# Patient Record
Sex: Female | Born: 1955 | Race: White | Hispanic: No | Marital: Married | Smoking: Never smoker
Health system: Southern US, Community
[De-identification: ages and names within clinical notes are randomized; demographics above are authoritative.]

## PROBLEM LIST (undated history)

## (undated) DIAGNOSIS — F419 Anxiety disorder, unspecified: Secondary | ICD-10-CM

## (undated) DIAGNOSIS — I10 Essential (primary) hypertension: Secondary | ICD-10-CM

## (undated) DIAGNOSIS — E785 Hyperlipidemia, unspecified: Secondary | ICD-10-CM

## (undated) DIAGNOSIS — H04123 Dry eye syndrome of bilateral lacrimal glands: Secondary | ICD-10-CM

## (undated) DIAGNOSIS — M1990 Unspecified osteoarthritis, unspecified site: Secondary | ICD-10-CM

## (undated) DIAGNOSIS — T7840XA Allergy, unspecified, initial encounter: Secondary | ICD-10-CM

## (undated) HISTORY — PX: TUBAL LIGATION: SHX77

## (undated) HISTORY — PX: OTHER SURGICAL HISTORY: SHX169

## (undated) HISTORY — DX: Hyperlipidemia, unspecified: E78.5

## (undated) HISTORY — PX: KNEE ARTHROSCOPY: SUR90

## (undated) HISTORY — DX: Essential (primary) hypertension: I10

## (undated) HISTORY — PX: CHOLECYSTECTOMY: SHX55

## (undated) HISTORY — DX: Anxiety disorder, unspecified: F41.9

## (undated) HISTORY — PX: URETHRAL SLING: SHX2621

## (undated) HISTORY — DX: Allergy, unspecified, initial encounter: T78.40XA

## (undated) HISTORY — DX: Unspecified osteoarthritis, unspecified site: M19.90

---

## 1999-08-16 ENCOUNTER — Encounter: Payer: Self-pay | Admitting: *Deleted

## 1999-08-16 ENCOUNTER — Encounter: Admission: RE | Admit: 1999-08-16 | Discharge: 1999-08-16 | Payer: Self-pay | Admitting: *Deleted

## 2000-02-22 ENCOUNTER — Ambulatory Visit (HOSPITAL_COMMUNITY): Admission: RE | Admit: 2000-02-22 | Discharge: 2000-02-22 | Payer: Self-pay | Admitting: Orthopedic Surgery

## 2000-02-22 ENCOUNTER — Encounter: Payer: Self-pay | Admitting: Orthopedic Surgery

## 2000-09-29 ENCOUNTER — Other Ambulatory Visit: Admission: RE | Admit: 2000-09-29 | Discharge: 2000-09-29 | Payer: Self-pay | Admitting: Emergency Medicine

## 2001-03-13 ENCOUNTER — Encounter: Admission: RE | Admit: 2001-03-13 | Discharge: 2001-03-13 | Payer: Self-pay | Admitting: Emergency Medicine

## 2001-03-13 ENCOUNTER — Encounter: Payer: Self-pay | Admitting: Emergency Medicine

## 2001-07-06 ENCOUNTER — Encounter: Admission: RE | Admit: 2001-07-06 | Discharge: 2001-07-06 | Payer: Self-pay | Admitting: Emergency Medicine

## 2001-07-06 ENCOUNTER — Encounter: Payer: Self-pay | Admitting: Emergency Medicine

## 2002-08-18 ENCOUNTER — Other Ambulatory Visit: Admission: RE | Admit: 2002-08-18 | Discharge: 2002-08-18 | Payer: Self-pay | Admitting: *Deleted

## 2002-09-24 ENCOUNTER — Encounter: Payer: Self-pay | Admitting: Emergency Medicine

## 2002-09-24 ENCOUNTER — Encounter: Admission: RE | Admit: 2002-09-24 | Discharge: 2002-09-24 | Payer: Self-pay | Admitting: Emergency Medicine

## 2002-10-08 ENCOUNTER — Ambulatory Visit (HOSPITAL_COMMUNITY): Admission: RE | Admit: 2002-10-08 | Discharge: 2002-10-08 | Payer: Self-pay | Admitting: *Deleted

## 2002-12-02 ENCOUNTER — Observation Stay (HOSPITAL_COMMUNITY): Admission: RE | Admit: 2002-12-02 | Discharge: 2002-12-03 | Payer: Self-pay | Admitting: General Surgery

## 2004-10-01 ENCOUNTER — Ambulatory Visit: Payer: Self-pay | Admitting: Gastroenterology

## 2004-10-12 ENCOUNTER — Ambulatory Visit: Payer: Self-pay | Admitting: Gastroenterology

## 2004-10-12 ENCOUNTER — Encounter (INDEPENDENT_AMBULATORY_CARE_PROVIDER_SITE_OTHER): Payer: Self-pay | Admitting: Specialist

## 2007-06-30 ENCOUNTER — Encounter: Admission: RE | Admit: 2007-06-30 | Discharge: 2007-06-30 | Payer: Self-pay | Admitting: Emergency Medicine

## 2009-09-20 ENCOUNTER — Encounter (INDEPENDENT_AMBULATORY_CARE_PROVIDER_SITE_OTHER): Payer: Self-pay | Admitting: *Deleted

## 2010-01-31 IMAGING — CT CT ABDOMEN W/ CM
2 of 5 series · 17 of 46 positions shown, 19 images · IV contrast (READICAT/WATER & [ID] OMNI 300)
Comparison: 09/24/2002

CT ABDOMEN

CLINICAL DATA: 51-year-old female intermittent right upper
quadrant pain.  Prior to cholecystectomy.

CT ABDOMEN AND PELVIS WITH CONTRAST
TECHNIQUE: Multidetector CT imaging of the abdomen and pelvis was
performed using the standard protocol following bolus
administration of intravenous contrast.
Contrast: 125 ml Lmnipaque-HNN

[Series 2: abdomen w/ · axial · 0.82mm/px · z∈[-448,-12]mm · 14 of 99 slices shown, 16 images]
[im 6/99  soft-tissue]
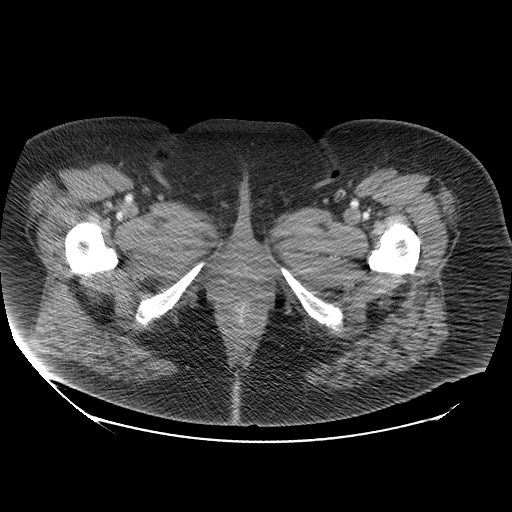
[im 6/99  bone]
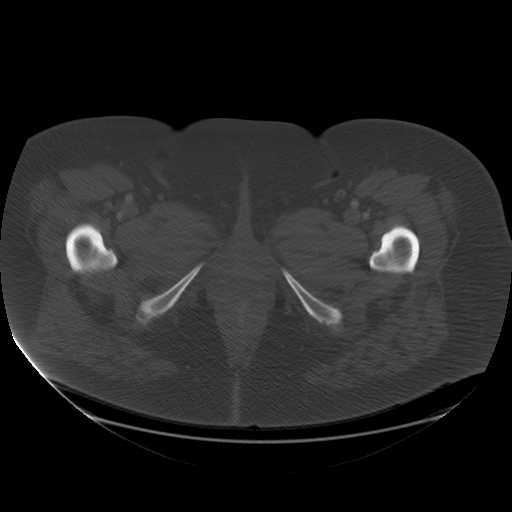
[im 11/99  soft-tissue]
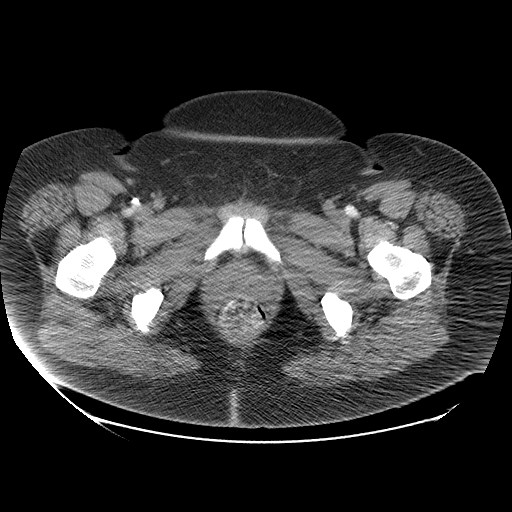
[im 21/99  soft-tissue]
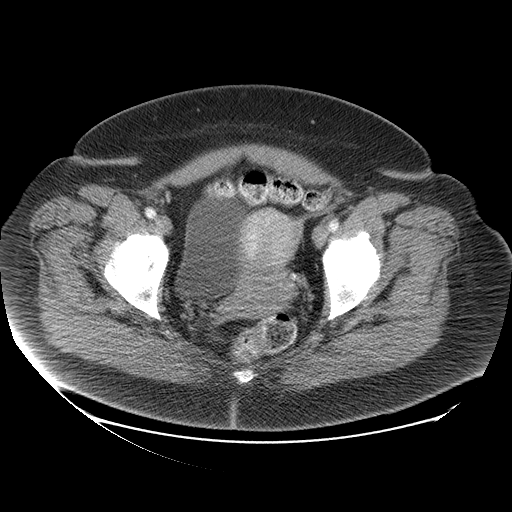
[im 26/99  soft-tissue]
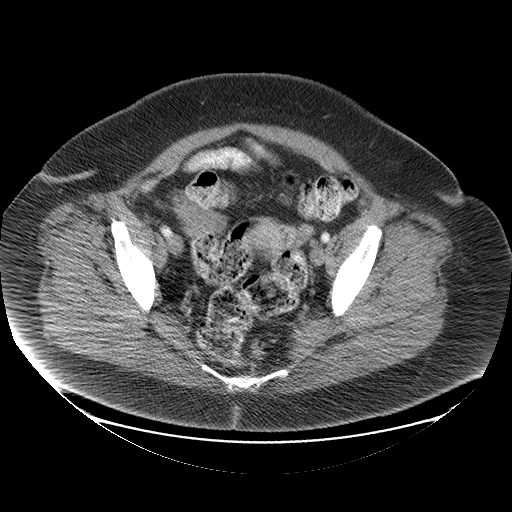
[im 31/99  soft-tissue]
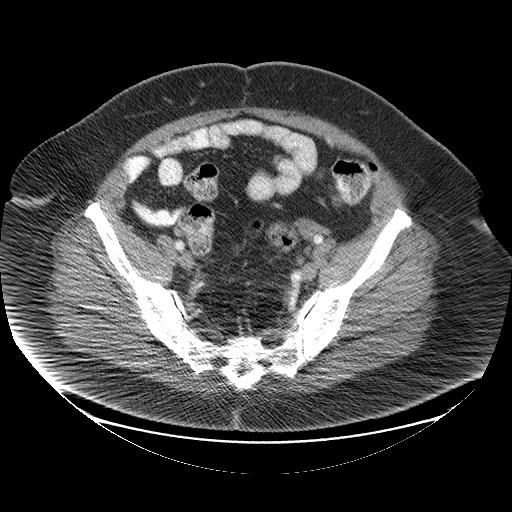
[im 42/99  soft-tissue]
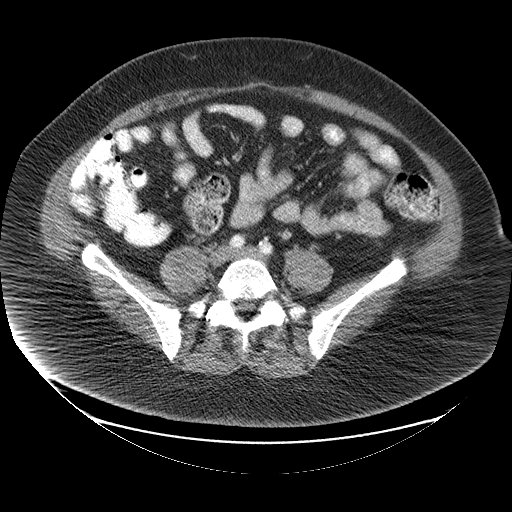
[im 47/99  soft-tissue]
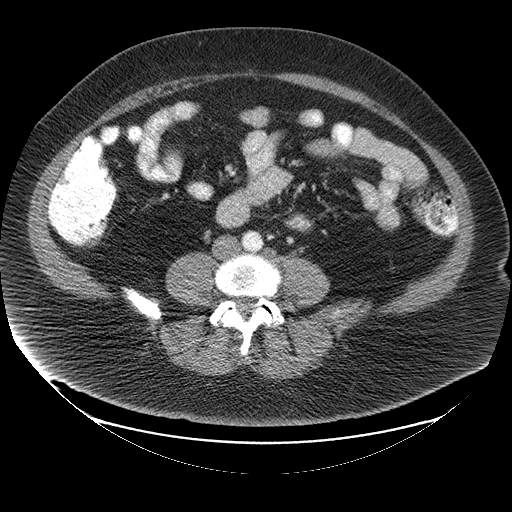
[im 52/99  soft-tissue]
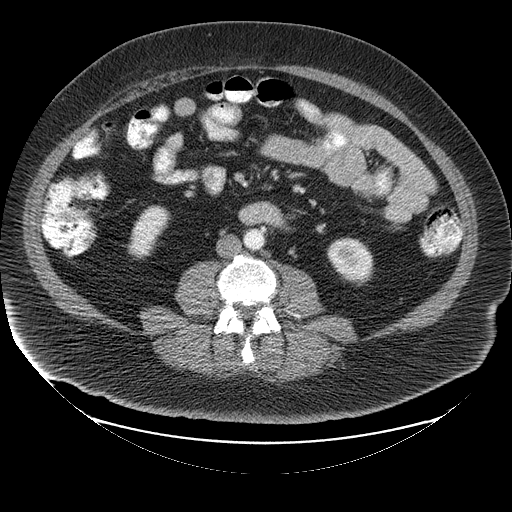
[im 57/99  soft-tissue]
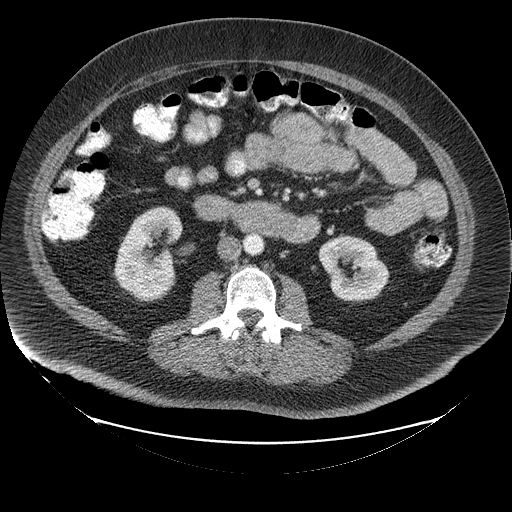
[im 57/99  bone]
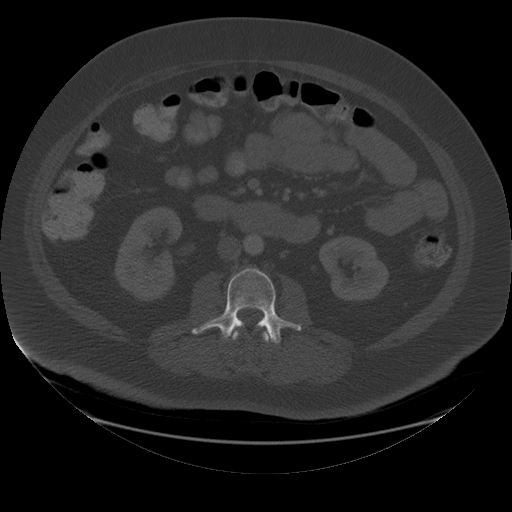
[im 68/99  soft-tissue]
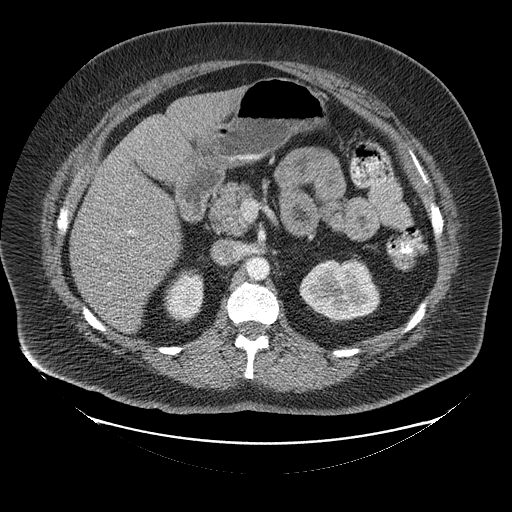
[im 73/99  soft-tissue]
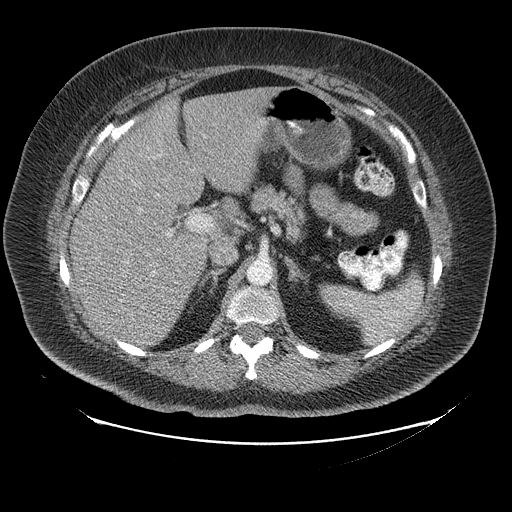
[im 78/99  soft-tissue]
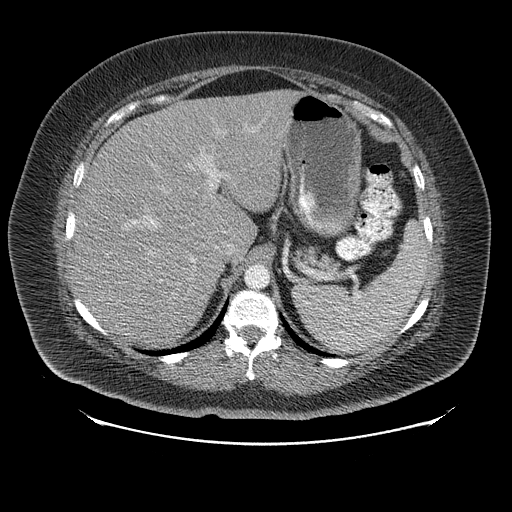
[im 88/99  soft-tissue]
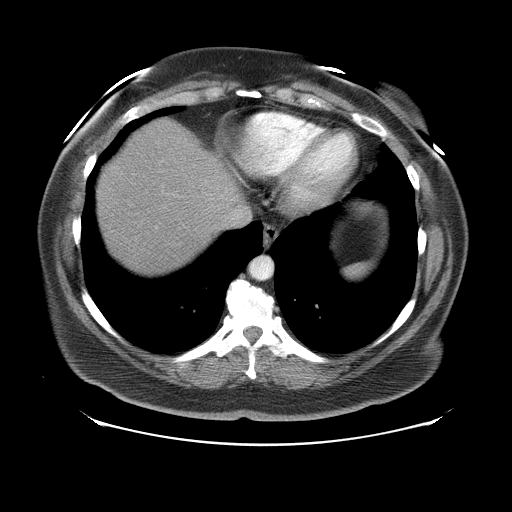
[im 93/99  soft-tissue]
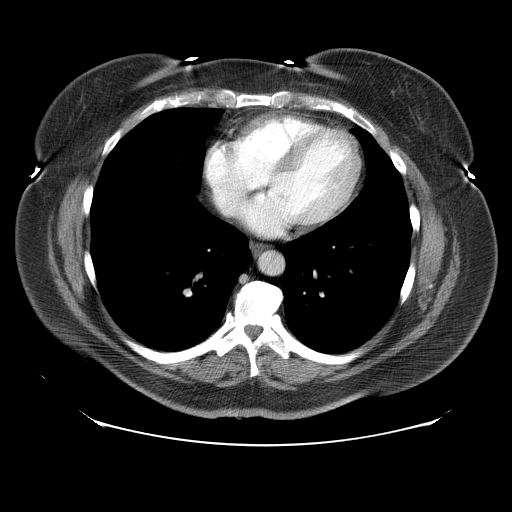

[Series 400: cor · coronal · 0.98mm/px · 3 of 137 slices shown]
[im 46/137  soft-tissue]
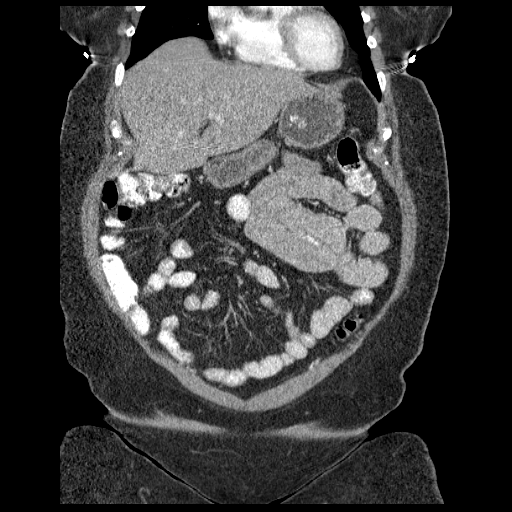
[im 61/137  soft-tissue]
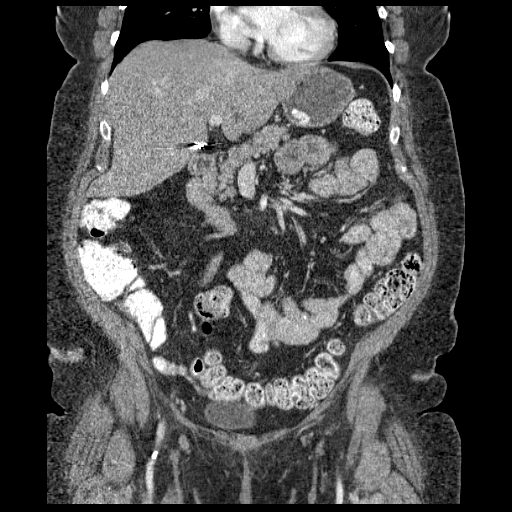
[im 76/137  soft-tissue]
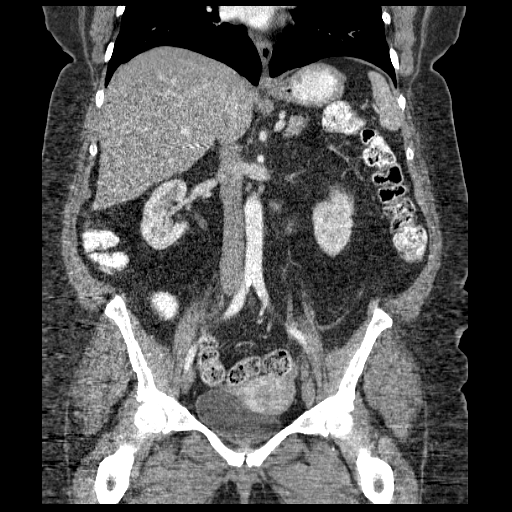

[17 of 46 positions shown; findings below may reference images not displayed]

FINDINGS: Lung bases are clear.  No pericardial or pleural
effusion.  Normal heart size.  No hiatal hernia.

In the abdomen, the patient is status post cholecystectomy.
Diffuse fatty infiltration is noted of the liver.  Hepatic and
portal veins are patent.  The right kidney, adrenal glands,
pancreas, spleen, and accessory splenules are within normal limits.
Left kidney demonstrates a stable 10 mm mid pole hypodense cortical
lesion compared to a CT exam from 09/24/2002 system with a small
renal cyst.  No renal obstruction or hydronephrosis.  No bowel
obstruction, dilatation, ileus pattern, or free air.  No free
fluid, abdominal acute inflammation, hemorrhage.  Mild degenerative
changes are noted of the lower thoracic and lumbar spine.
IMPRESSION: Mild fatty retraction of the liver suspected.

Status post cholecystectomy

Stable small left renal cyst.

No acute intra-abdominal finding.

CT PELVIS
FINDINGS: No pelvic free fluid, fluid collection, hemorrhage, or
abscess.  Urinary bladder, uterus and ovaries are within normal
limits.  Retained stool is noted in the distal colon.  No hernia.
No adenopathy.
IMPRESSION: No acute intrapelvic finding.

## 2010-02-22 NOTE — Letter (Signed)
Summary: Colonoscopy Letter  [facility]  [address] [place], [place] [address]   Phone: [phone]  Fax: [phone]      September 20, 2009 MRN: [id number]   LAKEYTA VANDENHEUVEL [address] [place], [place]  [address]   Dear Ms. Rizzi,   According to your medical record, it is time for you to schedule a Colonoscopy. The American Cancer Society recommends this procedure as a method to detect early colon cancer. Patients with a family history of colon cancer, or a personal history of colon polyps or inflammatory bowel disease are at increased risk.  This letter has beeen generated based on the recommendations made at the time of your procedure. If you feel that in your particular situation this may no longer apply, please contact our office.  Please call our office at [phone] to schedule this appointment or to update your records at your earliest convenience.  Thank you for cooperating with [place] to provide you with the very best care possible.   Sincerely,  Judie Petit T. Russella Dar, M.D.  [facility] [phone]

## 2010-06-08 NOTE — Op Note (Signed)
NAME:  April Bray, April Bray                          ACCOUNT NO.:  [id number]   MEDICAL RECORD NO.:  [id number]                   PATIENT TYPE:  AMB   LOCATION:  DFT[facility]                                 FACILITY:  [facility]   PHYSICIAN:  Timothy E. Earlene Plater, M.D.              DATE OF BIRTH:  11-16-1955   DATE OF PROCEDURE:  DATE OF DISCHARGE:  12/02/2002                                 OPERATIVE REPORT   PREOPERATIVE DIAGNOSIS:  Ventral incisional hernia.   POSTOPERATIVE DIAGNOSIS:  Adhesions.  No hernia found.   OPERATION/PROCEDURE:  1. Laparoscopy.  2. Lysis of adhesions.   SURGEON:  Timothy E. Earlene Plater, M.D.   ASSISTANT:  Angelia Mould. Derrell Lolling, M.D.   ANESTHESIA:  General.   INDICATIONS:  Please see the enclosed note.  Mrs. Crafts is 47.  She  underwent an open cholecystectomy in 1989.  Subsequently, she has had four  laparoscopic procedures via ___________ for gynecological reasons.  She is  obese, diabetic and hypertensive.  She was referred by Dr. Lorenz Coaster for  ventral incisional hernia.  She was examined in the office and it was  thought that she had one because of the bulge in the area.  Likewise, a CT  scan did show the rectus muscle bulging but no hernia.  After careful  explanation, the patient wished to proceed with laparoscopic repair of this  hernia.  This was carefully explained including the expectations of eventual  outcomes, long term, short term, and complications.  She was seen and  evaluated this morning.  Her medications are noted which are multiple.  Her  blood sugar is monitored.   DESCRIPTION OF PROCEDURE:  She was taken to the operating room and placed  supine.  General endotracheal anesthesia is administered.  A Foley catheter  was placed, PAS hose, orogastric tube.  The abdomen was prepped and draped  in the usual fashion.   We elected, Dr. Derrell Lolling and I, to use the Optiview to enter the abdomen and  the left upper quadrant.  This was accomplished by anesthetizing the  skin,  an incision made and the 11 mm port with mounted camera was gently advanced  through the abdominal wall.  There was a little bit of bleeding in the  muscular layer and vision was not good so we stopped and left the trocar in  place.  We made an incision in the infraumbilical area, identified,  peritoneum entered without complications, the Hasson catheter placed, the  abdomen insufflated and the scope introduced.   To our surprise, the 11 trocar in the left upper quadrant had actually  speared the mesentery of the small bowel.  We placed two 5 mm trocars, one  in the right lower quadrant and one in the left lower quadrant, under direct  vision.  We removed the loop of bowel from over the 11 trocar in the left  upper quadrant and very carefully  examined the mesentery and the bowel wall  with a close view with the camera, looking directly at the serosa of the  small bowel and the mesentery.  There was no perforation, no leakage, and no  bleeding.  We placed that loop of bowel in the left upper quadrant.   We then proceeded to bluntly take down a number of adhesions in the right  upper quadrant.  Once this was accomplished, to our surprise there was not a  fascial defect in the abdominal wall.  There were some scarring changes  where the adhesions had been but there was not a distinct hernia.  There  were no hernia edges or margins.  After careful consideration, we both  agreed and elected not to place mesh where was not a hernia.  The area was  checked.  The omentum exhibited no unusual bleeding.  We again inspected the  loop of small bowel in the left upper quadrant and there was no  complications.   With this, the procedure was complete.  The counts were correct.  Trocars  were removed under direct vision along with the CO2.  The infraumbilical  incision was tied with the previously placed #1 Vicryl.  I did place a #1  Vicryl in the fascia of the left upper quadrant 11 mm wound.   Skin incisions  were closed with 3-0 Vicryl.  Final counts were correct.  She tolerated it  well.  Dry sterile dressing were applied.  She was taken to the recovery  room in good condition.                                               Timothy E. Earlene Plater, M.D.    TED/MEDQ  D:  12/02/2002  T:  12/02/2002  Job:  [id number]   cc:   Reuben Likes, M.D.  [address]  [place]  [place] [address]  Fax: [phone]

## 2010-06-27 ENCOUNTER — Encounter: Payer: Self-pay | Admitting: Gastroenterology

## 2010-07-23 ENCOUNTER — Ambulatory Visit (AMBULATORY_SURGERY_CENTER): Payer: No Typology Code available for payment source | Admitting: *Deleted

## 2010-07-23 ENCOUNTER — Encounter: Payer: Self-pay | Admitting: Gastroenterology

## 2010-07-23 VITALS — Ht 65.0 in | Wt 256.6 lb

## 2010-07-23 DIAGNOSIS — Z800 Family history of malignant neoplasm of digestive organs: Secondary | ICD-10-CM

## 2010-07-23 DIAGNOSIS — Z8371 Family history of colonic polyps: Secondary | ICD-10-CM

## 2010-07-23 MED ORDER — PEG-KCL-NACL-NASULF-NA ASC-C 100 G PO SOLR: ORAL | Status: DC

## 2010-08-07 ENCOUNTER — Ambulatory Visit (AMBULATORY_SURGERY_CENTER): Payer: No Typology Code available for payment source | Admitting: Gastroenterology

## 2010-08-07 ENCOUNTER — Encounter: Payer: Self-pay | Admitting: Gastroenterology

## 2010-08-07 DIAGNOSIS — Z86010 Personal history of colonic polyps: Secondary | ICD-10-CM

## 2010-08-07 DIAGNOSIS — Z8371 Family history of colonic polyps: Secondary | ICD-10-CM

## 2010-08-07 DIAGNOSIS — D126 Benign neoplasm of colon, unspecified: Secondary | ICD-10-CM

## 2010-08-07 DIAGNOSIS — Z1211 Encounter for screening for malignant neoplasm of colon: Secondary | ICD-10-CM

## 2010-08-07 DIAGNOSIS — Z800 Family history of malignant neoplasm of digestive organs: Secondary | ICD-10-CM

## 2010-08-07 MED ORDER — SODIUM CHLORIDE 0.9 % IV SOLN: 500.0000 mL | INTRAVENOUS | Status: DC

## 2010-08-07 NOTE — Progress Notes (Signed)
No complaints on dicaharge. MAW

## 2010-08-08 ENCOUNTER — Telehealth: Payer: Self-pay

## 2010-08-08 NOTE — Telephone Encounter (Signed)

## 2010-08-13 ENCOUNTER — Encounter: Payer: Self-pay | Admitting: Gastroenterology

## 2012-07-14 ENCOUNTER — Encounter (HOSPITAL_COMMUNITY): Payer: Self-pay

## 2012-07-14 ENCOUNTER — Ambulatory Visit (HOSPITAL_COMMUNITY)
Admission: RE | Admit: 2012-07-14 | Discharge: 2012-07-14 | Disposition: A | Discharge status: Home / Self Care | Payer: No Typology Code available for payment source | Source: Ambulatory Visit | Attending: Orthopedic Surgery | Admitting: Orthopedic Surgery

## 2012-07-14 ENCOUNTER — Encounter (HOSPITAL_COMMUNITY): Payer: Self-pay | Admitting: Pharmacy Technician

## 2012-07-14 ENCOUNTER — Encounter (HOSPITAL_COMMUNITY)
Admission: RE | Admit: 2012-07-14 | Discharge: 2012-07-14 | Disposition: A | Discharge status: Home / Self Care | Payer: No Typology Code available for payment source | Source: Ambulatory Visit | Attending: Orthopedic Surgery | Admitting: Orthopedic Surgery

## 2012-07-14 DIAGNOSIS — M1710 Unilateral primary osteoarthritis, unspecified knee: Secondary | ICD-10-CM | POA: Insufficient documentation

## 2012-07-14 DIAGNOSIS — I10 Essential (primary) hypertension: Secondary | ICD-10-CM | POA: Insufficient documentation

## 2012-07-14 DIAGNOSIS — IMO0002 Reserved for concepts with insufficient information to code with codable children: Secondary | ICD-10-CM | POA: Insufficient documentation

## 2012-07-14 DIAGNOSIS — Z01818 Encounter for other preprocedural examination: Secondary | ICD-10-CM | POA: Insufficient documentation

## 2012-07-14 DIAGNOSIS — H04123 Dry eye syndrome of bilateral lacrimal glands: Secondary | ICD-10-CM

## 2012-07-14 HISTORY — DX: Dry eye syndrome of bilateral lacrimal glands: H04.123

## 2012-07-14 LAB — URINALYSIS, ROUTINE W REFLEX MICROSCOPIC
Bilirubin Urine: NEGATIVE
Glucose, UA: 250 mg/dL — AB
Hgb urine dipstick: NEGATIVE
Ketones, ur: NEGATIVE mg/dL
Nitrite: NEGATIVE
pH: 6.5 (ref 5.0–8.0)

## 2012-07-14 LAB — BASIC METABOLIC PANEL
BUN: 12 mg/dL (ref 6–23)
Calcium: 10.1 mg/dL (ref 8.4–10.5)
GFR calc Af Amer: >90 mL/min (ref >90)
GFR calc non Af Amer: >90 mL/min (ref >90)
Potassium: 4.7 mEq/L (ref 3.5–5.1)
Sodium: 139 mEq/L (ref 135–145)

## 2012-07-14 LAB — PROTIME-INR
INR: 0.9 (ref 0.00–1.49)
Prothrombin Time: 12.1 seconds (ref 11.6–15.2)

## 2012-07-14 LAB — URINE MICROSCOPIC-ADD ON

## 2012-07-14 LAB — CBC
Hemoglobin: 14.1 g/dL (ref 12.0–15.0)
MCHC: 33.3 g/dL (ref 30.0–36.0)
Platelets: 239 10*3/uL (ref 150–400)

## 2012-07-14 NOTE — Patient Instructions (Addendum)
20 BRITAINY KOZUB  07/14/2012   Your procedure is scheduled on: 6-30  -2014  Report to [facility] at   0700     AM   Call this number if you have problems the morning of surgery: [phone]  Or Presurgical Testing [phone](Kendrik Mcshan)      Do not eat food:After Midnight.    Take these medicines the morning of surgery with A SIP OF WATER: Zyrtec. Zoloft. No Diabetic meds or insulin AM of. Take 1/2 usual dose Lantus Pm before, no Pm dose of Byetta night before.   Do not wear jewelry, make-up or nail polish.  Do not wear lotions, powders, or perfumes. You may wear deodorant.  Do not shave 12 hours prior to first CHG shower(legs and under arms).(face and neck okay.)  Do not bring valuables to the hospital.  Contacts, dentures or bridgework,body piercing,  may not be worn into surgery.  Leave suitcase in the car. After surgery it may be brought to your room.  For patients admitted to the hospital, checkout time is 11:00 AM the day of discharge.   Patients discharged the day of surgery will not be allowed to drive home. Must have responsible person with you x 24 hours once discharged.  Name and phone number of your driver: Der Gagliano, spouse [phone] cell  Special Instructions: CHG(Chlorhedine 4%-"Hibiclens","Betasept","Aplicare") Shower Use Special Wash: see special instructions.(avoid face and genitals)   Please read over the following fact sheets that you were given: MRSA Information, Blood Transfusion fact sheet, Incentive Spirometry Instruction.    Failure to follow these instructions may result in Cancellation of your surgery.   Patient signature_______________________________________________________

## 2012-07-14 NOTE — Pre-Procedure Instructions (Addendum)
07-14-12 EKG 06-24-12-report with chart, CXR today. 07-14-12 1640 Labs faxed to Dr. Nilsa Nutting office, note urinalysis. 07-16-12- fax received-Cipro called in to pt's pharmacy per Dr. Nilsa Nutting office.

## 2012-07-14 NOTE — Progress Notes (Signed)
07-14-12 1635 Labs viewable in Epic, note Urinalysis and BMP(is a known diabetic).

## 2012-07-15 LAB — URINE CULTURE

## 2012-07-16 NOTE — H&P (Signed)
TOTAL KNEE ADMISSION H&P  Patient is being admitted for bilateral total knee arthroplasties.  Subjective:  Chief Complaint:  Bilaterally knee OA / pain.  HPI: April Bray, 57 y.o. female, has a history of pain and functional disability in the bilaterally knees due to arthritis and has failed non-surgical conservative treatments for greater than 12 weeks to includeNSAID's and/or analgesics and activity modification.  Onset of symptoms was gradual, starting 3 years ago with gradually worsening course since that time. The patient noted prior procedures on the knee to include  arthroscopy on the right knee(s).  Patient currently rates pain in the bilaterally knee(s) at 10 out of 10 with activity. Patient has night pain, worsening of pain with activity and weight bearing, pain that interferes with activities of daily living, pain with passive range of motion, crepitus and joint swelling.  Patient has evidence of periarticular osteophytes and joint space narrowing by imaging studies. There is no active signs of infection.  Risks, benefits and expectations were discussed with the patient. Patient understand the risks, benefits and expectations and wishes to proceed with surgery.   D/C Plans:   Home with HHPT  Post-op Meds:    Rx given for ASA, Zanaflex, Iron, Colace and MiraLax  Tranexamic Acid:   To be given  Decadron:    Not to be given - DM  FYI:    Xarelto for 2 weeks and then ASA for 4    Past Medical History  Diagnosis Date  . Allergy   . Anxiety   . Arthritis   . Diabetes mellitus   . Hypertension   . Hyperlipidemia   . Bilateral dry eyes 07-14-12    allery and dry eyes    Past Surgical History  Procedure Laterality Date  . Knee arthroscopy      right  . Tubal ligation    . Ovarion cyst      bilateral  . Cholecystectomy    . Urethral sling      No Known Allergies   History  Substance Use Topics  . Smoking status: Never Smoker   . Smokeless tobacco: Not on file  .  Alcohol Use: No    Family History  Problem Relation Age of Onset  . Colon polyps Father   . Colon cancer Maternal Grandmother   . Colon cancer Maternal Grandfather      Review of Systems  Constitutional: Negative.   HENT: Negative.   Eyes: Negative.   Respiratory: Negative.   Cardiovascular: Negative.   Gastrointestinal: Negative.   Genitourinary: Positive for urgency and frequency.  Musculoskeletal: Positive for joint pain.  Skin: Negative.   Neurological: Negative.   Endo/Heme/Allergies: Positive for environmental allergies.  Psychiatric/Behavioral: The patient is nervous/anxious.     Objective:  Physical Exam  Constitutional: She is oriented to person, place, and time. She appears well-developed and well-nourished.  HENT:  Head: Normocephalic and atraumatic.  Mouth/Throat: Oropharynx is clear and moist.  Eyes: Pupils are equal, round, and reactive to light.  Neck: Neck supple. No JVD present. No tracheal deviation present. No thyromegaly present.  Cardiovascular: Normal rate, regular rhythm, normal heart sounds and intact distal pulses.   Respiratory: Effort normal and breath sounds normal. No stridor. No respiratory distress. She has no wheezes.  GI: Soft. There is no tenderness. There is no guarding.  Musculoskeletal:       Right knee: She exhibits decreased range of motion, swelling and bony tenderness. She exhibits no effusion, no ecchymosis, no deformity, no  laceration and no erythema. Tenderness found.       Left knee: She exhibits decreased range of motion, swelling and bony tenderness. She exhibits no effusion, no ecchymosis, no deformity, no laceration and no erythema. Tenderness found.  Lymphadenopathy:    She has no cervical adenopathy.  Neurological: She is alert and oriented to person, place, and time.  Skin: Skin is warm and dry.  Psychiatric: She has a normal mood and affect.    Labs:  Estimated body mass index is 42.60 kg/(m^2) as calculated from the  following:   Height as of 08/07/10: 5\' 5"  (1.651 m).   Weight as of 08/07/10: 116.121 kg (256 lb).   Imaging Review Plain radiographs demonstrate severe degenerative joint disease of the bilaterally knee(s). The overall alignment is neutral. The bone quality appears to be good for age and reported activity level.  Assessment/Plan:  End stage arthritis, bilaterally knee   The patient history, physical examination, clinical judgment of the provider and imaging studies are consistent with end stage degenerative joint disease of the bilaterally knee(s) and total knee arthroplasty is deemed medically necessary. The treatment options including medical management, injection therapy arthroscopy and arthroplasty were discussed at length. The risks and benefits of total knee arthroplasty were presented and reviewed. The risks due to aseptic loosening, infection, stiffness, patella tracking problems, thromboembolic complications and other imponderables were discussed. The patient acknowledged the explanation, agreed to proceed with the plan and consent was signed. Patient is being admitted for inpatient treatment for surgery, pain control, PT, OT, prophylactic antibiotics, VTE prophylaxis, progressive ambulation and ADL's and discharge planning. The patient is planning to be discharged home with home health services.   Anastasio Auerbach Banessa Mao   PAC  07/16/2012, 10:38 AM

## 2012-07-20 ENCOUNTER — Encounter (HOSPITAL_COMMUNITY): Payer: Self-pay | Admitting: *Deleted

## 2012-07-20 ENCOUNTER — Encounter (HOSPITAL_COMMUNITY)
Admission: RE | Disposition: A | Discharge status: Skilled Nursing Facility | Payer: Self-pay | Source: Ambulatory Visit | Attending: Orthopedic Surgery

## 2012-07-20 ENCOUNTER — Ambulatory Visit (HOSPITAL_COMMUNITY): Payer: No Typology Code available for payment source | Admitting: Anesthesiology

## 2012-07-20 ENCOUNTER — Inpatient Hospital Stay (HOSPITAL_COMMUNITY)
Admission: RE | Admit: 2012-07-20 | Discharge: 2012-07-23 | DRG: 462 | Disposition: A | Discharge status: Skilled Nursing Facility | Payer: No Typology Code available for payment source | Source: Ambulatory Visit | Attending: Orthopedic Surgery | Admitting: Orthopedic Surgery

## 2012-07-20 ENCOUNTER — Encounter (HOSPITAL_COMMUNITY): Payer: Self-pay | Admitting: Anesthesiology

## 2012-07-20 DIAGNOSIS — M1710 Unilateral primary osteoarthritis, unspecified knee: Principal | ICD-10-CM | POA: Diagnosis present

## 2012-07-20 DIAGNOSIS — Z96653 Presence of artificial knee joint, bilateral: Secondary | ICD-10-CM

## 2012-07-20 DIAGNOSIS — E669 Obesity, unspecified: Secondary | ICD-10-CM | POA: Diagnosis present

## 2012-07-20 DIAGNOSIS — E119 Type 2 diabetes mellitus without complications: Secondary | ICD-10-CM | POA: Diagnosis present

## 2012-07-20 DIAGNOSIS — D62 Acute posthemorrhagic anemia: Secondary | ICD-10-CM | POA: Diagnosis not present

## 2012-07-20 DIAGNOSIS — Z6830 Body mass index (BMI) 30.0-30.9, adult: Secondary | ICD-10-CM

## 2012-07-20 DIAGNOSIS — D500 Iron deficiency anemia secondary to blood loss (chronic): Secondary | ICD-10-CM | POA: Diagnosis not present

## 2012-07-20 HISTORY — PX: TOTAL KNEE ARTHROPLASTY: SHX125

## 2012-07-20 LAB — GLUCOSE, CAPILLARY: Glucose-Capillary: 221 mg/dL — ABNORMAL HIGH (ref 70–99)

## 2012-07-20 LAB — TYPE AND SCREEN

## 2012-07-20 SURGERY — ARTHROPLASTY, KNEE, BILATERAL, TOTAL
Anesthesia: General | Site: Knee | Laterality: Bilateral | Wound class: Clean

## 2012-07-20 MED ORDER — INSULIN GLARGINE 100 UNIT/ML ~~LOC~~ SOLN
30.0000 [IU] | Freq: Every day | SUBCUTANEOUS | Status: DC
  Administered 2012-07-20 – 2012-07-22 (×3): 30 [IU] via SUBCUTANEOUS
  Filled 2012-07-20 (×4): qty 0.3

## 2012-07-20 MED ORDER — BUPIVACAINE-EPINEPHRINE PF 0.25-1:200000 % IJ SOLN
INTRAMUSCULAR | Status: AC
  Filled 2012-07-20: qty 30

## 2012-07-20 MED ORDER — EPHEDRINE SULFATE 50 MG/ML IJ SOLN
INTRAMUSCULAR | Status: DC | PRN
  Administered 2012-07-20: 5 mg via INTRAVENOUS

## 2012-07-20 MED ORDER — HYDROMORPHONE HCL PF 1 MG/ML IJ SOLN
INTRAMUSCULAR | Status: DC | PRN
  Administered 2012-07-20 (×2): 1 mg via INTRAVENOUS

## 2012-07-20 MED ORDER — POLYETHYLENE GLYCOL 3350 17 G PO PACK
17.0000 g | PACK | Freq: Two times a day (BID) | ORAL | Status: DC
  Administered 2012-07-20 – 2012-07-23 (×6): 17 g via ORAL

## 2012-07-20 MED ORDER — 0.9 % SODIUM CHLORIDE (POUR BTL) OPTIME
TOPICAL | Status: DC | PRN
  Administered 2012-07-20: 1000 mL

## 2012-07-20 MED ORDER — CEFAZOLIN SODIUM-DEXTROSE 2-3 GM-% IV SOLR
INTRAVENOUS | Status: AC
  Filled 2012-07-20: qty 50

## 2012-07-20 MED ORDER — METOCLOPRAMIDE HCL 10 MG PO TABS: 5.0000 mg | ORAL_TABLET | Freq: Three times a day (TID) | ORAL | Status: DC | PRN

## 2012-07-20 MED ORDER — DOCUSATE SODIUM 100 MG PO CAPS
100.0000 mg | ORAL_CAPSULE | Freq: Two times a day (BID) | ORAL | Status: DC
  Administered 2012-07-20 – 2012-07-23 (×6): 100 mg via ORAL

## 2012-07-20 MED ORDER — DIPHENHYDRAMINE HCL 25 MG PO CAPS: 25.0000 mg | ORAL_CAPSULE | Freq: Four times a day (QID) | ORAL | Status: DC | PRN

## 2012-07-20 MED ORDER — KETOROLAC TROMETHAMINE 30 MG/ML IJ SOLN
INTRAMUSCULAR | Status: DC | PRN
  Administered 2012-07-20: 30 mg

## 2012-07-20 MED ORDER — BUPIVACAINE HCL (PF) 0.25 % IJ SOLN
INTRAMUSCULAR | Status: DC | PRN
  Administered 2012-07-20: 7 mL
  Administered 2012-07-20: 8 mL

## 2012-07-20 MED ORDER — ROCURONIUM BROMIDE 100 MG/10ML IV SOLN
INTRAVENOUS | Status: DC | PRN
  Administered 2012-07-20: 50 mg via INTRAVENOUS

## 2012-07-20 MED ORDER — SERTRALINE HCL 25 MG PO TABS
25.0000 mg | ORAL_TABLET | Freq: Every morning | ORAL | Status: DC
  Administered 2012-07-21 – 2012-07-23 (×3): 25 mg via ORAL
  Filled 2012-07-20 (×3): qty 1

## 2012-07-20 MED ORDER — INSULIN ASPART 100 UNIT/ML ~~LOC~~ SOLN
0.0000 [IU] | SUBCUTANEOUS | Status: DC
Start: 2012-07-20 — End: 2012-07-20
  Administered 2012-07-20: 5 [IU] via SUBCUTANEOUS

## 2012-07-20 MED ORDER — INSULIN ASPART 100 UNIT/ML ~~LOC~~ SOLN
0.0000 [IU] | Freq: Three times a day (TID) | SUBCUTANEOUS | Status: DC
  Administered 2012-07-20: 5 [IU] via SUBCUTANEOUS
  Administered 2012-07-21: 3 [IU] via SUBCUTANEOUS
  Administered 2012-07-21: 8 [IU] via SUBCUTANEOUS
  Administered 2012-07-22 (×2): 5 [IU] via SUBCUTANEOUS
  Administered 2012-07-22: 3 [IU] via SUBCUTANEOUS
  Administered 2012-07-23: 5 [IU] via SUBCUTANEOUS
  Administered 2012-07-23: 08:00:00 via SUBCUTANEOUS

## 2012-07-20 MED ORDER — KETOROLAC TROMETHAMINE 30 MG/ML IJ SOLN
INTRAMUSCULAR | Status: AC
  Filled 2012-07-20: qty 2

## 2012-07-20 MED ORDER — HYDROMORPHONE HCL PF 1 MG/ML IJ SOLN
INTRAMUSCULAR | Status: AC
  Administered 2012-07-20: 1 mg via INTRAVENOUS
  Filled 2012-07-20: qty 1

## 2012-07-20 MED ORDER — METFORMIN HCL 500 MG PO TABS
1000.0000 mg | ORAL_TABLET | Freq: Two times a day (BID) | ORAL | Status: DC
  Administered 2012-07-20 – 2012-07-23 (×5): 1000 mg via ORAL
  Filled 2012-07-20 (×8): qty 2

## 2012-07-20 MED ORDER — PROPOFOL 10 MG/ML IV BOLUS
INTRAVENOUS | Status: DC | PRN
  Administered 2012-07-20: 200 mg via INTRAVENOUS

## 2012-07-20 MED ORDER — LACTATED RINGERS IV SOLN
INTRAVENOUS | Status: DC
  Administered 2012-07-20: 15:00:00 via INTRAVENOUS

## 2012-07-20 MED ORDER — SODIUM CHLORIDE 0.9 % IJ SOLN
INTRAMUSCULAR | Status: DC | PRN
  Administered 2012-07-20: 13:00:00

## 2012-07-20 MED ORDER — ZOLPIDEM TARTRATE 5 MG PO TABS: 5.0000 mg | ORAL_TABLET | Freq: Every evening | ORAL | Status: DC | PRN

## 2012-07-20 MED ORDER — SODIUM CHLORIDE 0.9 % IV SOLN
INTRAVENOUS | Status: DC
  Administered 2012-07-20 – 2012-07-21 (×2): via EPIDURAL
  Filled 2012-07-20 (×9): qty 20

## 2012-07-20 MED ORDER — LIDOCAINE HCL (CARDIAC) 20 MG/ML IV SOLN
INTRAVENOUS | Status: DC | PRN
  Administered 2012-07-20: 50 mg via INTRAVENOUS

## 2012-07-20 MED ORDER — FENTANYL CITRATE 0.05 MG/ML IJ SOLN
INTRAMUSCULAR | Status: DC | PRN
  Administered 2012-07-20: 50 ug via INTRAVENOUS
  Administered 2012-07-20 (×3): 100 ug via INTRAVENOUS

## 2012-07-20 MED ORDER — HYDROMORPHONE HCL PF 1 MG/ML IJ SOLN
0.5000 mg | INTRAMUSCULAR | Status: DC | PRN
  Administered 2012-07-21 – 2012-07-22 (×4): 1 mg via INTRAVENOUS
  Filled 2012-07-20 (×5): qty 1

## 2012-07-20 MED ORDER — HYDROCODONE-ACETAMINOPHEN 7.5-325 MG PO TABS
1.0000 | ORAL_TABLET | ORAL | Status: DC
  Administered 2012-07-20 – 2012-07-22 (×11): 2 via ORAL
  Administered 2012-07-22 (×2): 1 via ORAL
  Administered 2012-07-22 – 2012-07-23 (×4): 2 via ORAL
  Filled 2012-07-20 (×18): qty 2

## 2012-07-20 MED ORDER — CELECOXIB 200 MG PO CAPS
200.0000 mg | ORAL_CAPSULE | Freq: Two times a day (BID) | ORAL | Status: DC
  Administered 2012-07-20 – 2012-07-23 (×6): 200 mg via ORAL
  Filled 2012-07-20 (×8): qty 1

## 2012-07-20 MED ORDER — ATORVASTATIN CALCIUM 10 MG PO TABS
10.0000 mg | ORAL_TABLET | Freq: Every day | ORAL | Status: DC
  Administered 2012-07-20 – 2012-07-22 (×3): 10 mg via ORAL
  Filled 2012-07-20 (×4): qty 1

## 2012-07-20 MED ORDER — ONDANSETRON HCL 4 MG/2ML IJ SOLN
INTRAMUSCULAR | Status: DC | PRN
  Administered 2012-07-20: 4 mg via INTRAVENOUS

## 2012-07-20 MED ORDER — SODIUM CHLORIDE 0.9 % IR SOLN
Status: DC | PRN
  Administered 2012-07-20: 3000 mL

## 2012-07-20 MED ORDER — MENTHOL 3 MG MT LOZG: 1.0000 | LOZENGE | OROMUCOSAL | Status: DC | PRN

## 2012-07-20 MED ORDER — METHOCARBAMOL 500 MG PO TABS
500.0000 mg | ORAL_TABLET | Freq: Four times a day (QID) | ORAL | Status: DC | PRN
  Administered 2012-07-20 – 2012-07-23 (×10): 500 mg via ORAL
  Filled 2012-07-20 (×12): qty 1

## 2012-07-20 MED ORDER — BUPIVACAINE-EPINEPHRINE 0.25% -1:200000 IJ SOLN
INTRAMUSCULAR | Status: DC | PRN
  Administered 2012-07-20: 24 mL

## 2012-07-20 MED ORDER — SODIUM CHLORIDE 0.9 % IJ SOLN
INTRAMUSCULAR | Status: DC | PRN
  Administered 2012-07-20: 12:00:00

## 2012-07-20 MED ORDER — ONDANSETRON HCL 4 MG PO TABS: 4.0000 mg | ORAL_TABLET | Freq: Four times a day (QID) | ORAL | Status: DC | PRN

## 2012-07-20 MED ORDER — CHLORHEXIDINE GLUCONATE 4 % EX LIQD: 60.0000 mL | Freq: Once | CUTANEOUS | Status: DC

## 2012-07-20 MED ORDER — POTASSIUM CHLORIDE 2 MEQ/ML IV SOLN
INTRAVENOUS | Status: DC
  Administered 2012-07-20 – 2012-07-21 (×2): via INTRAVENOUS
  Filled 2012-07-20 (×11): qty 1000

## 2012-07-20 MED ORDER — INSULIN ASPART 100 UNIT/ML ~~LOC~~ SOLN
SUBCUTANEOUS | Status: AC
  Filled 2012-07-20: qty 1

## 2012-07-20 MED ORDER — PHENOL 1.4 % MT LIQD
1.0000 | OROMUCOSAL | Status: DC | PRN
Start: 2012-07-20 — End: 2012-07-23

## 2012-07-20 MED ORDER — ALUM & MAG HYDROXIDE-SIMETH 200-200-20 MG/5ML PO SUSP: 30.0000 mL | ORAL | Status: DC | PRN

## 2012-07-20 MED ORDER — KETOTIFEN FUMARATE 0.025 % OP SOLN: 1.0000 [drp] | Freq: Two times a day (BID) | OPHTHALMIC | Status: DC | PRN

## 2012-07-20 MED ORDER — METHOCARBAMOL 100 MG/ML IJ SOLN
500.0000 mg | Freq: Four times a day (QID) | INTRAVENOUS | Status: DC | PRN
  Administered 2012-07-20: 500 mg via INTRAVENOUS
  Filled 2012-07-20: qty 5

## 2012-07-20 MED ORDER — CEFAZOLIN SODIUM-DEXTROSE 2-3 GM-% IV SOLR
2.0000 g | INTRAVENOUS | Status: AC
  Administered 2012-07-20: 2 g via INTRAVENOUS

## 2012-07-20 MED ORDER — FERROUS SULFATE 325 (65 FE) MG PO TABS
325.0000 mg | ORAL_TABLET | Freq: Three times a day (TID) | ORAL | Status: DC
  Administered 2012-07-20 – 2012-07-23 (×8): 325 mg via ORAL
  Filled 2012-07-20 (×11): qty 1

## 2012-07-20 MED ORDER — BUPIVACAINE HCL (PF) 0.25 % IJ SOLN
INTRAMUSCULAR | Status: AC
  Filled 2012-07-20: qty 30

## 2012-07-20 MED ORDER — FLEET ENEMA 7-19 GM/118ML RE ENEM: 1.0000 | ENEMA | Freq: Once | RECTAL | Status: AC | PRN

## 2012-07-20 MED ORDER — EXENATIDE 10 MCG/0.04ML ~~LOC~~ SOPN
10.0000 ug | PEN_INJECTOR | Freq: Two times a day (BID) | SUBCUTANEOUS | Status: DC
  Administered 2012-07-21 – 2012-07-23 (×4): 10 ug via SUBCUTANEOUS

## 2012-07-20 MED ORDER — HYDROMORPHONE HCL PF 1 MG/ML IJ SOLN
0.2500 mg | INTRAMUSCULAR | Status: DC | PRN
  Administered 2012-07-20 (×2): 0.5 mg via INTRAVENOUS

## 2012-07-20 MED ORDER — RIVAROXABAN 10 MG PO TABS
10.0000 mg | ORAL_TABLET | ORAL | Status: DC
  Filled 2012-07-20: qty 1

## 2012-07-20 MED ORDER — INSULIN GLARGINE 100 UNIT/ML SOLOSTAR PEN: 20.0000 [IU] | PEN_INJECTOR | Freq: Every day | SUBCUTANEOUS | Status: DC

## 2012-07-20 MED ORDER — LACTATED RINGERS IV SOLN
INTRAVENOUS | Status: DC
  Administered 2012-07-20: 1000 mL via INTRAVENOUS
  Administered 2012-07-20: 12:00:00 via INTRAVENOUS

## 2012-07-20 MED ORDER — SODIUM CHLORIDE 0.9 % IV SOLN
INTRAVENOUS | Status: DC
  Administered 2012-07-20: 14:00:00 via EPIDURAL
  Filled 2012-07-20 (×6): qty 20

## 2012-07-20 MED ORDER — BISACODYL 10 MG RE SUPP: 10.0000 mg | Freq: Every day | RECTAL | Status: DC | PRN

## 2012-07-20 MED ORDER — METOCLOPRAMIDE HCL 5 MG/ML IJ SOLN: 5.0000 mg | Freq: Three times a day (TID) | INTRAMUSCULAR | Status: DC | PRN

## 2012-07-20 MED ORDER — CEFAZOLIN SODIUM-DEXTROSE 2-3 GM-% IV SOLR
2.0000 g | Freq: Four times a day (QID) | INTRAVENOUS | Status: AC
  Administered 2012-07-20 (×2): 2 g via INTRAVENOUS
  Filled 2012-07-20 (×2): qty 50

## 2012-07-20 MED ORDER — TRANEXAMIC ACID 100 MG/ML IV SOLN
1000.0000 mg | Freq: Once | INTRAVENOUS | Status: AC
  Administered 2012-07-20: 1000 mg via INTRAVENOUS
  Filled 2012-07-20: qty 10

## 2012-07-20 MED ORDER — MIDAZOLAM HCL 5 MG/5ML IJ SOLN
INTRAMUSCULAR | Status: DC | PRN
  Administered 2012-07-20: 2 mg via INTRAVENOUS

## 2012-07-20 MED ORDER — LORATADINE 10 MG PO TABS
10.0000 mg | ORAL_TABLET | Freq: Every day | ORAL | Status: DC
  Administered 2012-07-21 – 2012-07-23 (×3): 10 mg via ORAL
  Filled 2012-07-20 (×3): qty 1

## 2012-07-20 MED ORDER — ONDANSETRON HCL 4 MG/2ML IJ SOLN
4.0000 mg | Freq: Four times a day (QID) | INTRAMUSCULAR | Status: DC | PRN
  Administered 2012-07-21: 4 mg via INTRAVENOUS
  Filled 2012-07-20: qty 2

## 2012-07-20 MED ORDER — BUPIVACAINE LIPOSOME 1.3 % IJ SUSP
20.0000 mL | Freq: Once | INTRAMUSCULAR | Status: DC
  Filled 2012-07-20: qty 20

## 2012-07-20 SURGICAL SUPPLY — 64 items
ADH SKN CLS APL DERMABOND .7 (GAUZE/BANDAGES/DRESSINGS) ×2
BAG SPEC THK2 15X12 ZIP CLS (MISCELLANEOUS) ×1
BAG ZIPLOCK 12X15 (MISCELLANEOUS) ×2 IMPLANT
BANDAGE ELASTIC 6 VELCRO ST LF (GAUZE/BANDAGES/DRESSINGS) ×4 IMPLANT
BANDAGE ESMARK 6X9 LF (GAUZE/BANDAGES/DRESSINGS) ×1 IMPLANT
BLADE SAW RECIPROCATING 77.5 (BLADE) ×2 IMPLANT
BLADE SAW SGTL 13.0X1.19X90.0M (BLADE) ×4 IMPLANT
BLADE SURG SZ10 CARB STEEL (BLADE) ×2 IMPLANT
BNDG CMPR 9X6 STRL LF SNTH (GAUZE/BANDAGES/DRESSINGS) ×2
BNDG COHESIVE 4X5 TAN STRL (GAUZE/BANDAGES/DRESSINGS) ×2 IMPLANT
BNDG ESMARK 6X9 LF (GAUZE/BANDAGES/DRESSINGS) ×4
BONE CEMENT GENTAMICIN (Cement) ×8 IMPLANT
BOWL SMART MIX CTS (DISPOSABLE) ×4 IMPLANT
CAPT RP KNEE ×2 IMPLANT
CEMENT BONE GENTAMICIN 40 (Cement) IMPLANT
CLOTH BEACON ORANGE TIMEOUT ST (SAFETY) ×2 IMPLANT
CUFF TOURN SGL QUICK 34 (TOURNIQUET CUFF) ×4
CUFF TRNQT CYL 34X4X40X1 (TOURNIQUET CUFF) ×2 IMPLANT
DERMABOND ADVANCED (GAUZE/BANDAGES/DRESSINGS) ×2
DERMABOND ADVANCED .7 DNX12 (GAUZE/BANDAGES/DRESSINGS) ×2 IMPLANT
DRAPE EXTREMITY BILATERAL (DRAPE) ×2 IMPLANT
DRAPE POUCH INSTRU U-SHP 10X18 (DRAPES) ×2 IMPLANT
DRAPE U-SHAPE 47X51 STRL (DRAPES) ×6 IMPLANT
DRSG AQUACEL AG ADV 3.5X10 (GAUZE/BANDAGES/DRESSINGS) ×4 IMPLANT
DRSG PAD ABDOMINAL 8X10 ST (GAUZE/BANDAGES/DRESSINGS) ×4 IMPLANT
DRSG TEGADERM 4X4.75 (GAUZE/BANDAGES/DRESSINGS) ×4 IMPLANT
DURAPREP 26ML APPLICATOR (WOUND CARE) ×2 IMPLANT
ELECT REM PT RETURN 9FT ADLT (ELECTROSURGICAL) ×2
ELECTRODE REM PT RTRN 9FT ADLT (ELECTROSURGICAL) ×1 IMPLANT
EVACUATOR 1/8 PVC DRAIN (DRAIN) ×4 IMPLANT
FACESHIELD LNG OPTICON STERILE (SAFETY) ×6 IMPLANT
GAUZE SPONGE 2X2 8PLY STRL LF (GAUZE/BANDAGES/DRESSINGS) ×2 IMPLANT
GLOVE BIOGEL PI IND STRL 7.5 (GLOVE) ×1 IMPLANT
GLOVE BIOGEL PI IND STRL 8 (GLOVE) ×1 IMPLANT
GLOVE BIOGEL PI INDICATOR 7.5 (GLOVE) ×1
GLOVE BIOGEL PI INDICATOR 8 (GLOVE) ×1
GLOVE ECLIPSE 8.0 STRL XLNG CF (GLOVE) ×2 IMPLANT
GLOVE ORTHO TXT STRL SZ7.5 (GLOVE) ×5 IMPLANT
GOWN BRE IMP PREV XXLGXLNG (GOWN DISPOSABLE) ×4 IMPLANT
GOWN STRL NON-REIN LRG LVL3 (GOWN DISPOSABLE) ×2 IMPLANT
HANDPIECE INTERPULSE COAX TIP (DISPOSABLE) ×2
IMMOBILIZER KNEE 20 (SOFTGOODS)
IMMOBILIZER KNEE 20 THIGH 36 (SOFTGOODS) ×2 IMPLANT
KIT BASIN OR (CUSTOM PROCEDURE TRAY) ×2 IMPLANT
MANIFOLD NEPTUNE II (INSTRUMENTS) ×2 IMPLANT
NS IRRIG 1000ML POUR BTL (IV SOLUTION) ×3 IMPLANT
PACK TOTAL JOINT (CUSTOM PROCEDURE TRAY) ×2 IMPLANT
PADDING CAST COTTON 6X4 STRL (CAST SUPPLIES) ×2 IMPLANT
POSITIONER SURGICAL ARM (MISCELLANEOUS) ×1 IMPLANT
SET HNDPC FAN SPRY TIP SCT (DISPOSABLE) ×1 IMPLANT
SET PAD KNEE POSITIONER (MISCELLANEOUS) ×4 IMPLANT
SPONGE GAUZE 2X2 STER 10/PKG (GAUZE/BANDAGES/DRESSINGS) ×2
SPONGE LAP 18X18 X RAY DECT (DISPOSABLE) ×3 IMPLANT
STOCKINETTE 8 INCH (MISCELLANEOUS) ×2 IMPLANT
SUCTION FRAZIER 12FR DISP (SUCTIONS) ×2 IMPLANT
SUT MNCRL AB 4-0 PS2 18 (SUTURE) ×4 IMPLANT
SUT VIC AB 1 CT1 36 (SUTURE) ×2 IMPLANT
SUT VIC AB 2-0 CT1 27 (SUTURE) ×12
SUT VIC AB 2-0 CT1 TAPERPNT 27 (SUTURE) ×6 IMPLANT
SUT VLOC 180 0 24IN GS25 (SUTURE) ×4 IMPLANT
TOWEL OR 17X26 10 PK STRL BLUE (TOWEL DISPOSABLE) ×3 IMPLANT
TRAY FOLEY CATH 14FRSI W/METER (CATHETERS) ×2 IMPLANT
WATER STERILE IRR 1500ML POUR (IV SOLUTION) ×3 IMPLANT
WRAP KNEE MAXI GEL POST OP (GAUZE/BANDAGES/DRESSINGS) ×4 IMPLANT

## 2012-07-20 NOTE — Interval H&P Note (Signed)
History and Physical Interval Note:  07/20/2012 9:16 AM  April Bray  has presented today for surgery, with the diagnosis of BILATERAL KNEE OA  The various methods of treatment have been discussed with the patient and family. After consideration of risks, benefits and other options for treatment, the patient has consented to  Procedure(s) with comments: TOTAL KNEE BILATERAL (Bilateral) - SPINAL VERSES EPIDURAL as a surgical intervention .  The patient's history has been reviewed, patient examined, no change in status, stable for surgery.  I have reviewed the patient's chart and labs.  Questions were answered to the patient's satisfaction.     Shelda Pal

## 2012-07-20 NOTE — Anesthesia Preprocedure Evaluation (Addendum)
Anesthesia Evaluation  Patient identified by MRN, date of birth, ID band Patient awake    Reviewed: Allergy & Precautions, H&P , NPO status , Patient's Chart, lab work & pertinent test results  Airway Mallampati: II TM Distance: >3 FB Neck ROM: full    Dental no notable dental hx. (+) Teeth Intact and Dental Advisory Given   Pulmonary neg pulmonary ROS,  breath sounds clear to auscultation  Pulmonary exam normal       Cardiovascular hypertension, Pt. on medications Rhythm:regular Rate:Normal     Neuro/Psych negative neurological ROS  negative psych ROS   GI/Hepatic negative GI ROS, Neg liver ROS,   Endo/Other  diabetes, Well Controlled, Type 2, Oral Hypoglycemic AgentsMorbid obesity  Renal/GU negative Renal ROS  negative genitourinary   Musculoskeletal   Abdominal Normal abdominal exam  (+) + obese,   Peds  Hematology negative hematology ROS (+)   Anesthesia Other Findings   Reproductive/Obstetrics negative OB ROS                         Anesthesia Physical Anesthesia Plan  ASA: III  Anesthesia Plan: General   Post-op Pain Management:    Induction: Intravenous  Airway Management Planned: Oral ETT  Additional Equipment:   Intra-op Plan:   Post-operative Plan: Extubation in OR  Informed Consent: I have reviewed the patients History and Physical, chart, labs and discussed the procedure including the risks, benefits and alternatives for the proposed anesthesia with the patient or authorized representative who has indicated his/her understanding and acceptance.   Dental Advisory Given  Plan Discussed with: CRNA and Surgeon  Anesthesia Plan Comments:         Anesthesia Quick Evaluation

## 2012-07-20 NOTE — Anesthesia Postprocedure Evaluation (Signed)
  Anesthesia Post-op Note  Patient: April Bray  Procedure(s) Performed: Procedure(s) (LRB): TOTAL KNEE BILATERAL (Bilateral)  Patient Location: PACU  Anesthesia Type: GA combined with regional for post-op pain  Level of Consciousness: awake and alert   Airway and Oxygen Therapy: Patient Spontanous Breathing  Post-op Pain: mild  Post-op Assessment: Post-op Vital signs reviewed, Patient's Cardiovascular Status Stable, Respiratory Function Stable, Patent Airway and No signs of Nausea or vomiting  Last Vitals:  Filed Vitals:   07/20/12 1523  BP:   Pulse: 74  Temp: 36.8 C  Resp: 12    Post-op Vital Signs: stable   Complications: No apparent anesthesia complications

## 2012-07-20 NOTE — Op Note (Signed)
NAME:  April Bray                      MEDICAL RECORD NO.:  [id number]                             FACILITY:  [facility]      PHYSICIAN:  Madlyn Frankel. Charlann Boxer, M.D.  DATE OF BIRTH:  1955-10-08      DATE OF PROCEDURE:  07/20/2012                                     OPERATIVE REPORT         PREOPERATIVE DIAGNOSIS:  Bilateral knee osteoarthritis.      POSTOPERATIVE DIAGNOSIS:  Bilateral knee osteoarthritis.      FINDINGS:  The patient was noted to have complete loss of cartilage and   bone-on-bone arthritis with associated osteophytes in the medial and patellofemoral compartments of   both knees with  significant synovitis and associated effusion noted in both.      PROCEDURE:  Bilateral total knee replacement.      COMPONENTS USED:  Both knees had same component sizes placed.  DePuy rotating platform posterior stabilized knee   system, size 4N femurs, 3 tibial trays, 10 mm PS inserts, and 38 patellar   buttons.      SURGEON:  Madlyn Frankel. Charlann Boxer, M.D.      ASSISTANT:  Lanney Gins, PA-C.      ANESTHESIA:  General and Epidural.      SPECIMENS:  None.      COMPLICATION:  None.      DRAINS:  One Hemovac.  EBL: <200cc total       TOURNIQUET TIME:   Total Tourniquet Time Documented: Thigh (Left) - 36 minutes Total: Thigh (Left) - 36 minutes  Thigh (Right) - 41 minutes Total: Thigh (Right) - 41 minutes  .      The patient was stable to the recovery room.      INDICATION FOR PROCEDURE:  April Bray is a 57 y.o. female patient of   mine.  She had been seen, evaluated, and treated conservatively in the   office with medication, activity modification, and injections.  The patient had   radiographic changes of bone-on-bone arthritis with endplate sclerosis and osteophytes noted bilaterally with associated flexion contracture and varus deformities.      The patient failed conservative measures including medication, injections, and activity modification, and at this point was ready  for more definitive measures.   Based on the radiographic changes and failed conservative measures, the patient   decided to proceed with total knee replacement.  Risks of infection,   DVT, component failure, need for revision surgery, postop course, and   expectations were all   discussed and reviewed.  Consent was obtained for benefit of pain   relief.      PROCEDURE IN DETAIL:  The patient was brought to the operative theater.   Once adequate anesthesia, preoperative antibiotics, 2 gm of Ancef administered, the patient was positioned supine with bilateral thigh tourniquets placed.  Both lower extremities were prepped and draped in sterile fashion.  A time-   out was performed identifying the patient, planned procedures, and   extremities.      Both lower extremiies were placed in the [facility] leg holder.  Attention was first directed to the left leg.  Following the procedure on the left after capsular closure attention was directed to right knee.    The procedures were identical for each knee.   The leg was exsanguinated, tourniquet elevated to 250 mmHg.  A midline incision was   made followed by median parapatellar arthrotomy.  Following initial   exposure, attention was first directed to the patella.  Precut   measurement was noted to be 23 mm.  I resected down to 13-14 mm and used a   38 patellar button to restore patellar height as well as cover the cut   surface.      The lug holes were drilled and a metal shim was placed to protect the   patella from retractors and saw blades.      At this point, attention was now directed to the femur.  The femoral   canal was opened with a drill, irrigated to try to prevent fat emboli.  An   intramedullary rod was passed at 3 degrees valgus, 10 mm of bone was   resected off the distal femur.  Following this resection, the tibia was   subluxated anteriorly.  Using the extramedullary guide, 10 mm of bone was resected off   the proximal  lateral tibia.  We confirmed the gap would be   stable medially and laterally with a 10 mm insert as well as confirmed   the cut was perpendicular in the coronal plane, checking with an alignment rod.      Once this was done, I sized the femur to be a size 4 in the anterior-   posterior dimension, chose a narrow component based on medial and   lateral dimension.  The size 4 rotation block was then pinned in   position anterior referenced using the C-clamp to set rotation.  The   anterior, posterior, and  chamfer cuts were made without difficulty nor   notching making certain that I was along the anterior cortex to help   with flexion gap stability.      The final box cut was made off the lateral aspect of distal femur.      At this point, the tibia was sized to be a size 3, the size 3 tray was   then pinned in position through the medial third of the tubercle,   drilled, and keel punched.  Trial reduction was now carried with a 4N femur,  3 tibia, a 10 mm insert, and the 38 patella botton.  The knee was brought to   extension, full extension with good flexion stability with the patella   tracking through the trochlea without application of pressure.  Given   all these findings, the trial components removed.  Final components were   opened and cement was mixed.  The knee was irrigated with normal saline   solution and pulse lavage.  The synovial lining was   then injected with 0.25% Marcaine with epinephrine and 1 cc of Toradol,   total of 61 cc.      The knee was irrigated.  Final implants were then cemented onto clean and   dried cut surfaces of bone with the knee brought to extension with a 10 mm trial insert.      Once the cement had fully cured, the excess cement was removed   throughout the knee.  I confirmed I was satisfied with the range of   motion and stability, and the final  10 mm PS insert was chosen.  It was   placed into the knee.      The tourniquet had been let down  at 36 minutes.  No significant   hemostasis required.  The medium Hemovac drain was placed deep.  The   extensor mechanism was then reapproximated using #1 Vicryl with the knee   in flexion.  The   remaining wound was closed with 2-0 Vicryl and running 4-0 Monocryl.   The knee was cleaned, dried, dressed sterilely using Dermabond and   Aquacel dressing.  Drain site dressed separately.  The patient was then   brought to recovery room in stable condition, tolerating the procedure   well.   Again same surgical technique was carried out for the right knee.  Measurements were the same, components were the same. The only difference is the tourniquet time, on the right it was 41 minutes  Please note that Physician Assistant, Lanney Gins, was present for the entirety of the case, and was utilized for pre-operative positioning, peri-operative retractor management, general facilitation of the procedure.  He was also utilized for primary wound closure at the end of the case.              Madlyn Frankel Charlann Boxer, M.D.

## 2012-07-20 NOTE — Anesthesia Procedure Notes (Addendum)
Spinal  Patient location during procedure: OR Staffing Performed by: anesthesiologist  Preanesthetic Checklist Completed: patient identified, site marked, surgical consent, pre-op evaluation, timeout performed, IV checked, risks and benefits discussed and monitors and equipment checked Spinal Block Patient position: sitting Prep: Betadine Patient monitoring: heart rate, continuous pulse ox and blood pressure Injection technique: single-shot Needle Needle type: Quincke  Needle gauge: 22 G Needle length: 9 cm Additional Notes Expiration date of kit checked and confirmed. Patient tolerated procedure well, without complications.  Attempt x 4    Epidural Patient location during procedure: holding area Start time: 07/20/2012 9:15 AM End time: 07/20/2012 9:30 AM  Staffing Anesthesiologist: Ronelle Nigh L Performed by: anesthesiologist   Preanesthetic Checklist Completed: patient identified, site marked, surgical consent, pre-op evaluation, timeout performed, IV checked, risks and benefits discussed, monitors and equipment checked and post-op pain management  Epidural Patient position: sitting Prep: Betadine Patient monitoring: heart rate, continuous pulse ox and blood pressure Injection technique: LOR saline  Needle:  Needle type: Hustead  Needle gauge: 18 G Needle length: 9 cm and 9 Catheter type: closed end flexible Catheter size: 20 Guage Test dose: negative and 1.5% lidocaine  Assessment Sensory level: T10  Additional Notes Test dose 1.5% Lidocaine with epi 1:200,000  Patient tolerated the insertion well without complications.Reason for block:post-op pain management

## 2012-07-20 NOTE — Progress Notes (Signed)
Dr. Leta Jungling called regarding blood sugar 244.

## 2012-07-20 NOTE — Transfer of Care (Signed)
Immediate Anesthesia Transfer of Care Note  Patient: April Bray  Procedure(s) Performed: Procedure(s) (LRB): TOTAL KNEE BILATERAL (Bilateral)  Patient Location: PACU  Anesthesia Type: General  Level of Consciousness: sedated, patient cooperative and responds to stimulaton  Airway & Oxygen Therapy: Patient Spontanous Breathing and Patient connected to face mask oxgen  Post-op Assessment: Report given to PACU RN and Post -op Vital signs reviewed and stable  Post vital signs: Reviewed and stable  Complications: No apparent anesthesia complications

## 2012-07-20 NOTE — OR Nursing (Signed)
Closing time for left knee is 1144.

## 2012-07-21 ENCOUNTER — Encounter (HOSPITAL_COMMUNITY): Payer: Self-pay | Admitting: Orthopedic Surgery

## 2012-07-21 LAB — BASIC METABOLIC PANEL
BUN: 13 mg/dL (ref 6–23)
CO2: 28 mEq/L (ref 19–32)
Calcium: 7.9 mg/dL — ABNORMAL LOW (ref 8.4–10.5)
Creatinine, Ser: 0.59 mg/dL (ref 0.50–1.10)
Glucose, Bld: 176 mg/dL — ABNORMAL HIGH (ref 70–99)

## 2012-07-21 LAB — CBC
MCH: 28.3 pg (ref 26.0–34.0)
MCV: 84.7 fL (ref 78.0–100.0)
Platelets: 172 10*3/uL (ref 150–400)
RBC: 3.46 MIL/uL — ABNORMAL LOW (ref 3.87–5.11)

## 2012-07-21 MED ORDER — RIVAROXABAN 10 MG PO TABS
10.0000 mg | ORAL_TABLET | ORAL | Status: DC
  Administered 2012-07-21 – 2012-07-22 (×2): 10 mg via ORAL
  Filled 2012-07-21 (×3): qty 1

## 2012-07-21 NOTE — Progress Notes (Signed)
Dr. Charlann Boxer requests epidural be removed today.  Catheter removed. Tip intact. Site clean.  [employer]

## 2012-07-21 NOTE — Progress Notes (Signed)
POD #1 s/p bil TKR  Pt resting and comfortable. No complaints.   AFVSS  Some motor weakness on L LE.  Site clean dry intact  Continue epidural at current settings.  [employer]

## 2012-07-21 NOTE — Evaluation (Signed)
Occupational Therapy Evaluation Patient Details Name: April Bray MRN: [id number] DOB: [birth year] Today's Date: 07/21/2012 Time: 4098-1191 and 9:17 - 9:38 OT Time Calculation (min): 35 min  OT Assessment / Plan / Recommendation History of present illness pt is 57 yo admitted 07/20/12 for bilateral TKA.    Clinical Impression   This 57 year old female was admitted for Bil TKA.  Prior to admission, she was independent with adls but she had pain and was limited with some activities.  She will benefit from skilled OT to increase independence with adls with min A level goals for toileting and min guard for standing for adls.      OT Assessment  Patient needs continued OT Services    Follow Up Recommendations  Home health OT (pt wants to go home, HHOT depending upon progress)    Barriers to Discharge      Equipment Recommendations  3 in 1 bedside comode (may be able to borrow)    Recommendations for Other Services    Frequency  Min 2X/week    Precautions / Restrictions Precautions Precautions: Knee Required Braces or Orthoses: Knee Immobilizer - Right;Knee Immobilizer - Left Restrictions Weight Bearing Restrictions: No   Pertinent Vitals/Pain Pain 2/10.  Has epidural.  Sitting bp 107/67 dizzy then 99/61. Returned to bed with 111/69    ADL  Lower Body Bathing: +2 Total assistance Lower Body Bathing: Patient Percentage: 30% Where Assessed - Lower Body Bathing: Rolling right and/or left Lower Body Dressing: +2 Total assistance Lower Body Dressing: Patient Percentage: 10% Where Assessed - Lower Body Dressing: Rolling right and/or left Equipment Used: Reacher Transfers/Ambulation Related to ADLs: pt sat eob but felt dizzy.  Attempted to stand and pt was unable.  ADL Comments: Educated on AE,  Pt has a Sports administrator and long handled bath sponge.  She will have help with adls as needed. She feels she can borrow at 3:1.  Pt able to move RLE, SLR without assist.  she can perform UB adls with  hob raised at set up level.  A x 2 is needed for LB adls.       OT Diagnosis:    OT Problem List: Decreased strength;Decreased activity tolerance;Decreased knowledge of use of DME or AE;Pain;Cardiopulmonary status limiting activity OT Treatment Interventions: Self-care/ADL training;DME and/or AE instruction;Patient/family education;Therapeutic activities   OT Goals(Current goals can be found in the care plan section) Acute Rehab OT Goals Patient Stated Goal: home; be able to move better without pain OT Goal Formulation: With patient Time For Goal Achievement: 07/28/12 Potential to Achieve Goals: Good ADL Goals Pt Will Transfer to Toilet: with min assist;ambulating;bedside commode Additional ADL Goal #1: Pt will go from sit to stand with mod A and maintain for 2 minutes with min guard for adls.  Visit Information  Last OT Received On: 07/21/12 Assistance Needed: +2 PT/OT Co-Evaluation/Treatment: Yes History of Present Illness: pt is 57 yo admitted 07/20/12 for bilateral TKA.        Prior Functioning     Home Living Family/patient expects to be discharged to:: Private residence Living Arrangements: Spouse/significant other Available Help at Discharge: Family Type of Home: House Home Access: Stairs to enter Secretary/administrator of Steps: 1 Entrance Stairs-Rails: None Home Layout: Two level (can stay on first level) Alternate Level Stairs-Number of Steps: 13 Home Equipment: Shower seat;Cane - single point (can borrow walker--not sure about wheels, may have 3:1 ) Prior Function Level of Independence: Independent Communication Communication: No difficulties  Vision/Perception     Cognition  Cognition Arousal/Alertness: Awake/alert Behavior During Therapy: WFL for tasks assessed/performed Overall Cognitive Status: Within Functional Limits for tasks assessed    Extremity/Trunk Assessment Upper Extremity Assessment Upper Extremity Assessment: Overall WFL for  tasks assessed Lower Extremity Assessment Lower Extremity Assessment: RLE deficits/detail;LLE deficits/detail RLE Deficits / Details: able to perform SLR, flex knee to 40 degrees. LLE Deficits / Details: pt unable to  perform SLR. kne flex to 40 LLE Sensation: decreased light touch (has epidural)     Mobility Bed Mobility Bed Mobility: Supine to Sit Supine to Sit: 1: +2 Total assist Supine to Sit: Patient Percentage: 50% Details for Bed Mobility Assistance: cues for technique Transfers Details for Transfer Assistance: attempted to stand, and pt unable     Exercise     Balance     End of Session OT - End of Session Activity Tolerance:  (limited by dizziness)  GO     April Bray 07/21/2012, 9:56 AM April Bray, OTR/L [phone] 07/21/2012

## 2012-07-21 NOTE — Progress Notes (Signed)
[facility]   [facility] is providing the following services: RW and Commode  If patient discharges after hours, please call [phone].   Renard Hamper [phone] 07/21/2012, 10:41 AM

## 2012-07-21 NOTE — Progress Notes (Signed)
Physical Therapy Treatment Patient Details Name: April Bray MRN: [id number] DOB: Nov 24, 1955 Today's Date: 07/21/2012 Time: 4098-1191 PT Time Calculation (min): 46 min  PT Assessment / Plan / Recommendation  PT Comments   . Pt not able to get into standing. C/o progressive dizziness. LLE is not supportive soapplied KI and R knee was not supportive without KI  Follow Up Recommendations  Home health PT     Does the patient have the potential to tolerate intense rehabilitation     Barriers to Discharge        Equipment Recommendations  None recommended by PT    Recommendations for Other Services    Frequency 7X/week   Progress towards PT Goals Progress towards PT goals: Progressing toward goals  Plan Current plan remains appropriate    Precautions / Restrictions Precautions Precautions: Knee   Pertinent Vitals/Pain C/o dizziness upon sitting.107/67 104/63,99/61. Returned to supine with 111/69,RN aware. Increased throb in L knee   Mobility  Bed Mobility Details for Bed Mobility Assistance: did not assess as need KI for standing and not avbailable. Transfers Transfers: Sit to Stand;Stand to Sit Details for Transfer Assistance: attempted to stand, and pt unable, pt unable to get full weight on R leg with no KI, KI on L. Will need bil KI until epidural out.  Ambulation/Gait Ambulation/Gait Assistance: Not tested (comment)    Exercises    PT Diagnosis: Difficulty walking;Generalized weakness  PT Problem List: Decreased strength;Decreased range of motion;Decreased activity tolerance;Decreased knowledge of use of DME;Impaired sensation;Decreased mobility PT Treatment Interventions: DME instruction;Gait training;Stair training;Functional mobility training;Therapeutic exercise;Therapeutic activities;Patient/family education   PT Goals (current goals can now be found in the care plan section) Acute Rehab PT Goals Patient Stated Goal: home; be able to move better without pain PT  Goal Formulation: With patient Time For Goal Achievement: 07/28/12 Potential to Achieve Goals: Good Additional Goals Additional Goal #1: independent HEP  Visit Information  Last PT Received On: 07/21/12 Assistance Needed: +2 History of Present Illness: pt is 57 yo admitted 07/20/12 for bilateral TKA.     Subjective Data  Patient Stated Goal: home; be able to move better without pain   Cognition  Cognition Arousal/Alertness: Awake/alert Behavior During Therapy: WFL for tasks assessed/performed Overall Cognitive Status: Within Functional Limits for tasks assessed    Balance  Balance Balance Assessed: Yes Dynamic Sitting Balance Dynamic Sitting - Balance Support: Left upper extremity supported Dynamic Sitting - Comments: Pt actually lost balance when reaching to rail, appears  trunk maay be affected by epidural, LLE is much weaker.  End of Session PT - End of Session Equipment Utilized During Treatment: Left knee immobilizer Activity Tolerance: Treatment limited secondary to medical complications (Comment) (pt could not astand. ? due to epidural effects.) Patient left: in bed Nurse Communication: Mobility status   GP     [name] 07/21/2012, 12:31 PM

## 2012-07-21 NOTE — Progress Notes (Signed)
PT Cancellation Note  Patient Details Name: April Bray MRN: [id number] DOB: 03-Dec-1955   Cancelled Treatment:     PM session cancelled, pt has been nauseated.   [name] 07/21/2012, 4:44 PM

## 2012-07-21 NOTE — Care Management Note (Signed)
  Page 2 of 2   07/21/2012     3:36:54 PM   CARE MANAGEMENT NOTE 07/21/2012  Patient:  April Bray, April Bray   Account Number:  [id number]  Date Initiated:  07/21/2012  Documentation initiated by:  Colleen Can  Subjective/Objective Assessment:   dx bilateral knee osteoarthritis; bilateral knee replacemnt on day of admission     Action/Plan:   CM spoke with patient. Plans are for her to return to her home in [place] where her spouse and daughter will be caregivers. States Genevieve Norlander will provide [facility] services. DME has been delivered to pt's room.   Anticipated DC Date:  07/23/2012   Anticipated DC Plan:  HOME W HOME HEALTH SERVICES  In-house referral  Clinical Social Worker      DC Planning Services  CM consult      PAC Choice  DURABLE MEDICAL EQUIPMENT  HOME HEALTH   Choice offered to / List presented to:  C-1 Patient   DME arranged  3-N-1  Levan Hurst      DME agency  [facility]     HH arranged  HH-2 PT      [facility] agency  [facility]   Status of service:  In process, will continue to follow Medicare Important Message given?   (If response is "NO", the following Medicare IM given date fields will be blank) Date Medicare IM given:   Date Additional Medicare IM given:    Discharge Disposition:    Per UR Regulation:    If discussed at Long Length of Stay Meetings, dates discussed:    Comments:

## 2012-07-21 NOTE — Evaluation (Signed)
Physical Therapy Evaluation Patient Details Name: April Bray MRN: [id number] DOB: Jun 03, 1955 Today's Date: 07/21/2012 Time: 4098-1191 PT Time Calculation (min): 46 min  PT Assessment / Plan / Recommendation History of Present Illness  pt is 57 yo admitted 07/20/12 for bilateral TKA.   Clinical Impression  Pt has increased numbness and decreased motor control of L leg. Pt doing well with R SLR and quad sets.    PT Assessment  Patient needs continued PT services    Follow Up Recommendations  Home health PT    Does the patient have the potential to tolerate intense rehabilitation      Barriers to Discharge        Equipment Recommendations  None recommended by PT    Recommendations for Other Services     Frequency 7X/week    Precautions / Restrictions Precautions Precautions: Knee Required Braces or Orthoses: Knee Immobilizer - Right;Knee Immobilizer - Left Restrictions Weight Bearing Restrictions: No   Pertinent Vitals/Pain 2   R and  L      Mobility  Bed Mobility Bed Mobility: Supine to Sit Supine to Sit: 1: +2 Total assist Supine to Sit: Patient Percentage: 50% Details for Bed Mobility Assistance: did not assess as need KI for standing and not avbailable. Transfers Details for Transfer Assistance: attempted to stand, and pt unable Ambulation/Gait Ambulation/Gait Assistance: Not tested (comment)    Exercises Total Joint Exercises Quad Sets: AROM;Both;10 reps Short Arc Quad: AROM;Left;10 reps Heel Slides: AROM;AAROM;Right;Left;10 reps Hip ABduction/ADduction: AAROM;Right;Left;10 reps Straight Leg Raises: AAROM;Right;Left;AROM;10 reps   PT Diagnosis: Difficulty walking;Generalized weakness  PT Problem List: Decreased strength;Decreased range of motion;Decreased activity tolerance;Decreased knowledge of use of DME;Impaired sensation;Decreased mobility PT Treatment Interventions: DME instruction;Gait training;Stair training;Functional mobility  training;Therapeutic exercise;Therapeutic activities;Patient/family education     PT Goals(Current goals can be found in the care plan section) Acute Rehab PT Goals Patient Stated Goal: home; be able to move better without pain PT Goal Formulation: With patient Time For Goal Achievement: 07/28/12 Potential to Achieve Goals: Good  Visit Information  Last PT Received On: 07/21/12 Assistance Needed: +2 History of Present Illness: pt is 57 yo admitted 07/20/12 for bilateral TKA.        Prior Functioning  Home Living Family/patient expects to be discharged to:: Private residence Living Arrangements: Spouse/significant other Available Help at Discharge: Family Type of Home: House Home Access: Stairs to enter Secretary/administrator of Steps: 1 Entrance Stairs-Rails: None Home Layout: Two level (can stay on first level) Alternate Level Stairs-Number of Steps: 13 Home Equipment: Shower seat;Cane - single point (can borrow walker--not sure about wheels, may have 3:1 ) Prior Function Level of Independence: Independent Communication Communication: No difficulties    Cognition  Cognition Arousal/Alertness: Awake/alert Behavior During Therapy: WFL for tasks assessed/performed Overall Cognitive Status: Within Functional Limits for tasks assessed    Extremity/Trunk Assessment Upper Extremity Assessment Upper Extremity Assessment: Overall WFL for tasks assessed Lower Extremity Assessment Lower Extremity Assessment: RLE deficits/detail;LLE deficits/detail RLE Deficits / Details: able to perform SLR, flex knee to 40 degrees. LLE Deficits / Details: pt unable to  perform SLR. kne flex to 40 LLE Sensation: decreased light touch (has epidural)   Balance    End of Session PT - End of Session Activity Tolerance: Patient tolerated treatment well Patient left: in bed Nurse Communication: Mobility status  GP     April Bray 07/21/2012, 9:59 AM

## 2012-07-21 NOTE — Progress Notes (Signed)
CSW received referral for New SNF.  CSW reviewed chart and noted that PT/OT recommending Home Health PT/OT.  Inappropriate CSW referral.  CSW signing off.   Please re-consult if further social work needs arise.   Jacklynn Lewis, MSW, LCSWA (coverage for Cori Razor) Clinical Social Work

## 2012-07-21 NOTE — Progress Notes (Signed)
   Subjective: 1 Day Post-Op Procedure(s) (LRB): TOTAL KNEE BILATERAL (Bilateral)   Patient reports pain as mild, pain well controlled. Epidural still in place. Able to lift the right leg, little slow on lifting the left leg. No events throughout the night.   Objective:   VITALS:   Filed Vitals:   07/21/12 0610  BP: 106/67  Pulse: 62  Temp: 97.6 F (36.4 C)  Resp: 21    Dorsiflexion/Plantar flexion intact Incision: no drainage No cellulitis present Compartment soft  LABS  Recent Labs  07/21/12 0534  HGB 9.8*  HCT 29.3*  WBC 8.9  PLT 172     Recent Labs  07/21/12 0534  NA 134*  K 4.0  BUN 13  CREATININE 0.59  GLUCOSE 176*     Assessment/Plan: 1 Day Post-Op Procedure(s) (LRB): TOTAL KNEE BILATERAL (Bilateral) Maintain foley until after epidural is removed HV drains removed Advance diet Up with therapy D/C IV fluids Discharge home with home health  Expected ABLA  Treated with iron and will observe  Obese (BMI 30-39.9) Estimated body mass index is 39.61 kg/(m^2) as calculated from the following:   Height as of this encounter: 5\' 5"  (1.651 m).   Weight as of this encounter: 107.956 kg (238 lb). Patient also counseled that weight may inhibit the healing process Patient counseled that losing weight will help with future health issues     Anastasio Auerbach. Ople Girgis   PAC  07/21/2012, 7:51 AM

## 2012-07-21 NOTE — Progress Notes (Signed)
Utilization review completed.  

## 2012-07-21 NOTE — Progress Notes (Signed)
Inpatient Diabetes Program Recommendations  AACE/ADA: New Consensus Statement on Inpatient Glycemic Control (2013)  Target Ranges:  Prepandial:   less than 140 mg/dL      Peak postprandial:   less than 180 mg/dL (1-2 hours)      Critically ill patients:  140 - 180 mg/dL   Reason for Visit: Hyperglycemia  Results for April, Bray (MRN [id number]) as of 07/21/2012 14:32  Ref. Range 07/20/2012 13:43 07/20/2012 16:44 07/20/2012 22:58 07/21/2012 07:00 07/21/2012 12:09  Glucose-Capillary Latest Range: 70-99 mg/dL 147 (H) 829 (H) 562 (H) 153 (H) 246 (H)     Inpatient Diabetes Program Recommendations Insulin - Meal Coverage: Add meal coverage insulin - Novolog 4 units tidwc if pt eats >50% meal  Note: Will continue to follow.  HgbA1C needed but H/H low at present.  Thank you. Ailene Ards, RD, LDN, CDE Inpatient Diabetes Coordinator [phone]

## 2012-07-22 DIAGNOSIS — D500 Iron deficiency anemia secondary to blood loss (chronic): Secondary | ICD-10-CM | POA: Diagnosis not present

## 2012-07-22 DIAGNOSIS — E669 Obesity, unspecified: Secondary | ICD-10-CM | POA: Diagnosis present

## 2012-07-22 LAB — CBC
HCT: 30.4 % — ABNORMAL LOW (ref 36.0–46.0)
Hemoglobin: 10.2 g/dL — ABNORMAL LOW (ref 12.0–15.0)
MCV: 84.2 fL (ref 78.0–100.0)
RBC: 3.61 MIL/uL — ABNORMAL LOW (ref 3.87–5.11)
WBC: 8.5 10*3/uL (ref 4.0–10.5)

## 2012-07-22 LAB — BASIC METABOLIC PANEL
BUN: 8 mg/dL (ref 6–23)
CO2: 28 mEq/L (ref 19–32)
Chloride: 100 mEq/L (ref 96–112)
Creatinine, Ser: 0.57 mg/dL (ref 0.50–1.10)
Glucose, Bld: 255 mg/dL — ABNORMAL HIGH (ref 70–99)

## 2012-07-22 LAB — GLUCOSE, CAPILLARY
Glucose-Capillary: 221 mg/dL — ABNORMAL HIGH (ref 70–99)
Glucose-Capillary: 161 mg/dL — ABNORMAL HIGH (ref 70–99)

## 2012-07-22 NOTE — Progress Notes (Signed)
Physical Therapy Treatment Patient Details Name: April Bray MRN: [id number] DOB: March 29, 1955 Today's Date: 07/22/2012 Time: 9562-1308 PT Time Calculation (min): 33 min  PT Assessment / Plan / Recommendation  PT Comments   Pt tolerated well first standing and ambulation. Pt wants to consider rehab knowing challenges of getting up.  Follow Up Recommendations  SNF (pt has considered need for rehab.)     Does the patient have the potential to tolerate intense rehabilitation     Barriers to Discharge        Equipment Recommendations  Rolling walker with 5" wheels    Recommendations for Other Services    Frequency 7X/week   Progress towards PT Goals Progress towards PT goals: Progressing toward goals  Plan Discharge plan needs to be updated    Precautions / Restrictions Precautions Precautions: Knee Required Braces or Orthoses: Knee Immobilizer - Right;Knee Immobilizer - Left Knee Immobilizer - Right: Discontinue once straight leg raise with < 10 degree lag Knee Immobilizer - Left: Discontinue once straight leg raise with < 10 degree lag   Pertinent Vitals/Pain 5 each knee, ice applied.    Mobility  Bed Mobility Supine to Sit: 3: Mod assist Details for Bed Mobility Assistance: pt used leg lifter to move each leg, support of each leg to lower to floor. Transfers Transfers: Stand Pivot Transfers Sit to Stand: 1: +2 Total assist;From bed;From elevated surface;With upper extremity assist Sit to Stand: Patient Percentage: 60% Stand to Sit: To chair/3-in-1;With upper extremity assist;With armrests Stand Pivot Transfers: 1: +2 Total assist Stand Pivot Transfers: Patient Percentage: 60% Details for Transfer Assistance: cues to walk legs back and forward as needed to stand at RW and to sit to surface with arm rests. bil. KI utilized.  Ambulation/Gait Ambulation/Gait Assistance: 1: +2 Total assist Ambulation/Gait: Patient Percentage: 60% Ambulation Distance (Feet): 20  Feet Assistive device: Rolling walker Ambulation/Gait Assistance Details: cues for posture and for equence, bil. KI utilized. Gait Pattern: Step-to pattern;Decreased step length - right;Decreased step length - left    Exercises Total Joint Exercises Ankle Circles/Pumps: AROM;Both;10 reps Quad Sets: AROM;Both;15 reps Short Arc QuadBarbaraann Boys;Both;15 reps Heel Slides: AAROM;Both;15 reps Hip ABduction/ADduction: AAROM;Both;15 reps Straight Leg Raises: AAROM;Both;15 reps Goniometric ROM: R=10-45; L= 10 40   PT Diagnosis:    PT Problem List:   PT Treatment Interventions:     PT Goals (current goals can now be found in the care plan section)    Visit Information  Last PT Received On: 07/22/12 Assistance Needed: +2    Subjective Data      Cognition  Cognition Arousal/Alertness: Awake/alert    Balance     End of Session PT - End of Session Equipment Utilized During Treatment: Right knee immobilizer;Left knee immobilizer Activity Tolerance: Patient tolerated treatment well Patient left: in chair;with call bell/phone within reach Nurse Communication: Mobility status   GP     [name] 07/22/2012, 10:26 AM

## 2012-07-22 NOTE — Progress Notes (Signed)
Pt has chosen [facility] for [employer]. SNF will assist with insurance authorization process. CSW will continue to follow to assist with d/c planning to SNF.  Cori Razor LCSW [phone]

## 2012-07-22 NOTE — Progress Notes (Signed)
   Subjective: 2 Days Post-Op Procedure(s) (LRB): TOTAL KNEE BILATERAL (Bilateral)   Patient reports pain as mild, pain well controlled. She did have a spike of pain if difficulty controlling the pain once the epidural was removed. Nurses yesterday and throughout the night eventually got it under control. She is much more comfortable this morning. She feels that she would benefit from a SNF, to assist her until she gets up to speed.  Objective:   VITALS:   Filed Vitals:   07/22/12 0513  BP: 131/70  Pulse: 64  Temp: 97.7 F (36.5 C)  Resp: 18    Neurovascular intact Dorsiflexion/Plantar flexion intact Incision: dressing C/D/I No cellulitis present Compartment soft  LABS  Recent Labs  07/21/12 0534 07/22/12 0447  HGB 9.8* 10.2*  HCT 29.3* 30.4*  WBC 8.9 8.5  PLT 172 180     Recent Labs  07/21/12 0534 07/22/12 0447  NA 134* 135  K 4.0 4.3  BUN 13 8  CREATININE 0.59 0.57  GLUCOSE 176* 255*     Assessment/Plan: 2 Days Post-Op Procedure(s) (LRB): TOTAL KNEE BILATERAL (Bilateral) Up with therapy Discharge to SNF eventually when ready  Expected ABLA  Treated with iron and will observe   Obese (BMI 30-39.9)  Estimated body mass index is 39.61 kg/(m^2) as calculated from the following:      Height as of this encounter: 5\' 5"  (1.651 m).      Weight as of this encounter: 107.956 kg (238 lb).  Patient also counseled that weight may inhibit the healing process  Patient counseled that losing weight will help with future health issues      April Bray. April Bray   PAC  07/22/2012, 12:16 PM

## 2012-07-22 NOTE — Progress Notes (Signed)
Physical Therapy Treatment Patient Details Name: April Bray MRN: [id number] DOB: February 15, 1955 Today's Date: 07/22/2012 Time: 1478-2956 PT Time Calculation (min): 25 min  PT Assessment / Plan / Recommendation  PT Comments   Pt tolerated increased ROM, unable to perform SLR today on R.   Follow Up Recommendations  SNF (pt has considered need for rehab.)     Does the patient have the potential to tolerate intense rehabilitation     Barriers to Discharge        Equipment Recommendations  Rolling walker with 5" wheels    Recommendations for Other Services    Frequency 7X/week   Progress towards PT Goals Progress towards PT goals: Progressing toward goals  Plan Discharge plan needs to be updated    Precautions / Restrictions Precautions Precautions: Knee Required Braces or Orthoses: Knee Immobilizer - Right;Knee Immobilizer - Left Knee Immobilizer - Right: Discontinue once straight leg raise with < 10 degree lag Knee Immobilizer - Left: Discontinue once straight leg raise with < 10 degree lag   Pertinent Vitals/Pain R=2-5; L=3-5 has been premedicated, ice applied.    Mobility       Exercises Total Joint Exercises Ankle Circles/Pumps: AROM;Both;10 reps Quad Sets: AROM;Both;15 reps Short Arc QuadBarbaraann Bray;Both;15 reps Heel Slides: AAROM;Both;15 reps Hip ABduction/ADduction: AAROM;Both;15 reps Straight Leg Raises: AAROM;Both;15 reps Goniometric ROM: R=10-45; L= 10 40   PT Diagnosis:    PT Problem List:   PT Treatment Interventions:     PT Goals (current goals can now be found in the care plan section)    Visit Information  Last PT Received On: 07/22/12 Assistance Needed: +1    Subjective Data      Cognition  Cognition Arousal/Alertness: Awake/alert    Balance     End of Session PT - End of Session Activity Tolerance: Patient tolerated treatment well Patient left: in bed   GP     April Bray 07/22/2012, 10:11 AM

## 2012-07-22 NOTE — Progress Notes (Addendum)
Clinical Social Work Department CLINICAL SOCIAL WORK PLACEMENT NOTE 07/22/2012  Patient:  April Bray, April Bray  Account Number:  [id number] Admit date:  07/20/2012  Clinical Social Worker:  Cori Razor, LCSW  Date/time:  07/22/2012 12:52 PM  Clinical Social Work is seeking post-discharge placement for this patient at the following level of care:   SKILLED NURSING   (*CSW will update this form in Epic as items are completed)   07/22/2012  Patient/family provided with [facility] Department of Clinical Social Work's list of facilities offering this level of care within the geographic area requested by the patient (or if unable, by the patient's family).  07/22/2012  Patient/family informed of their freedom to choose among providers that offer the needed level of care, that participate in Medicare, Medicaid or managed care program needed by the patient, have an available bed and are willing to accept the patient.    Patient/family informed of [facility]' ownership interest in [facility], as well as of the fact that they are under no obligation to receive care at this facility.  PASARR submitted to EDS on 07/22/2012 PASARR number received from EDS on   FL2 transmitted to all facilities in geographic area requested by pt/family on  07/22/2012 FL2 transmitted to all facilities within larger geographic area on   Patient informed that his/her managed care company has contracts with or will negotiate with  certain facilities, including the following:     Patient/family informed of bed offers received:  07/22/12 Patient chooses bed at [facility] Physician recommends and patient chooses bed at    Patient to be transferred to [facility] on  07/23/12 Patient to be transferred to facility by [facility]  The following physician request were entered in Epic:   Additional Comments:  Cori Razor LCSW [phone]

## 2012-07-22 NOTE — Progress Notes (Signed)
Clinical Social Work Department BRIEF PSYCHOSOCIAL ASSESSMENT 07/22/2012  Patient:  April Bray, April Bray     Account Number:  [id number]     Admit date:  07/20/2012  Clinical Social Worker:  Candie Chroman  Date/Time:  07/22/2012 12:43 PM  Referred by:  Physician  Date Referred:  07/22/2012 Referred for  SNF Placement   Other Referral:   Interview type:  Patient Other interview type:    PSYCHOSOCIAL DATA Living Status:  HUSBAND Admitted from facility:   Level of care:   Primary support name:  Chelsea Aus Primary support relationship to patient:  SPOUSE Degree of support available:   supportive    CURRENT CONCERNS Current Concerns  Post-Acute Placement   Other Concerns:    SOCIAL WORK ASSESSMENT / PLAN Pt is a 57 yr old female living at home prior to hospitalization. CSW met with pt / spouse to assist with d/c planning. Pt was hoping to go home following hospital d/c but now feels ST Rehab would be beneficial. SNF search has been initiated and bed offers will be provided this afternoon. Insurance pre authorization is needed for SNF placemnt and CSW has begun this process.   Assessment/plan status:  Psychosocial Support/Ongoing Assessment of Needs Other assessment/ plan:   Information/referral to community resources:   SNF list provided. Insurance coverage for placement reviewed.    PATIENT'S/FAMILY'S RESPONSE TO PLAN OF CARE: Pt is disappointed she isn't able to return  right home after hospitalization but is looking forward to ST Rehab.   Cori Razor LCSW [phone]

## 2012-07-22 NOTE — Progress Notes (Signed)
Physical Therapy Treatment Patient Details Name: JANNAT ROSEMEYER MRN: [id number] DOB: Oct 14, 1955 Today's Date: 07/22/2012 Time: 4098-1191 PT Time Calculation (min): 28 min  PT Assessment / Plan / Recommendation  PT Comments   POD # 2 B TKR pm session.  Applied b KI's and instructed pt on use.  Assisted out of recliner to amb in hallway then back to bed per pt request.  Pt plans to D/C to SNF for ST Rehab due to her slow progress having B TKR.    Follow Up Recommendations  SNF     Does the patient have the potential to tolerate intense rehabilitation     Barriers to Discharge        Equipment Recommendations       Recommendations for Other Services    Frequency 7X/week   Progress towards PT Goals    Plan      Precautions / Restrictions Precautions Precautions: Knee Required Braces or Orthoses: Knee Immobilizer - Right;Knee Immobilizer - Left Knee Immobilizer - Left: Discontinue once straight leg raise with < 10 degree lag Restrictions Weight Bearing Restrictions: No    Pertinent Vitals/Pain C/o 4/10 pain with amb ICE applied    Mobility  Bed Mobility Bed Mobility: Sit to Supine Sit to Supine: 3: Mod assist Details for Bed Mobility Assistance: assisted B LE onto bed Transfers Transfers: Sit to Stand;Stand to Sit Sit to Stand: 1: +2 Total assist;From chair/3-in-1 Sit to Stand: Patient Percentage: 60% Stand to Sit: 1: +2 Total assist;To bed Stand to Sit: Patient Percentage: 60% Details for Transfer Assistance: 25% VC's on proper tech and hand placement Ambulation/Gait Ambulation/Gait Assistance: 1: +2 Total assist Ambulation/Gait: Patient Percentage: 70% Ambulation Distance (Feet): 55 Feet Assistive device: Rolling walker Ambulation/Gait Assistance Details: increased time and VC's on proper walker to self distance and safety with turns Gait Pattern: Step-to pattern;Decreased step length - right;Decreased step length - left Gait velocity: decreased     PT Goals  (current goals can now be found in the care plan section)    Visit Information  Last PT Received On: 07/22/12 Assistance Needed: +2 History of Present Illness: pt is 57 yo admitted 07/20/12 for bilateral TKA.     Subjective Data      Cognition    good   Balance   fair  End of Session PT - End of Session Equipment Utilized During Treatment: Right knee immobilizer;Left knee immobilizer;Gait belt Activity Tolerance: Patient tolerated treatment well Patient left: in bed;with call bell/phone within reach;with family/visitor present   Felecia Shelling  PTA [facility]  Acute  Rehab Pager      [phone]

## 2012-07-22 NOTE — Progress Notes (Signed)
Occupational Therapy Treatment Patient Details Name: April Bray MRN: [id number] DOB: [birth year] Today's Date: 07/22/2012 Time: 4098-1191 OT Time Calculation (min): 27 min  OT Assessment / Plan / Recommendation  OT comments  Pt progressing well with OT  Follow Up Recommendations  SNF (vs HHOT depending upon progress)    Barriers to Discharge       Equipment Recommendations  3 in 1 bedside comode    Recommendations for Other Services    Frequency Min 2X/week   Progress towards OT Goals Progress towards OT goals: Progressing toward goals  Plan      Precautions / Restrictions Precautions Precautions: Knee Required Braces or Orthoses: Knee Immobilizer - Right;Knee Immobilizer - Left Knee Immobilizer - Right: Discontinue once straight leg raise with < 10 degree lag Knee Immobilizer - Left: Discontinue once straight leg raise with < 10 degree lag Restrictions Weight Bearing Restrictions: No   Pertinent Vitals/Pain 5/10 bil knees.  Repositioned.  Left with PT    ADL  Toilet Transfer: Performed;+2 Total assistance Toilet Transfer: Patient Percentage: 60% Equipment Used: Leg lifter Transfers/Ambulation Related to ADLs: assisted with leg lifter to get to eob.  Transferred to 3:1 then ambulated to door.   ADL Comments: assisted with legs for stand to sit for 3:1 commode.  Pt able to manage them when standing up, A x 2 for safety Pt able to stand with min A.     OT Diagnosis:    OT Problem List:   OT Treatment Interventions:     OT Goals(current goals can now be found in the care plan section)    Visit Information  Last OT Received On: 07/22/12 Assistance Needed: +2 PT/OT Co-Evaluation/Treatment: Yes    Subjective Data      Prior Functioning       Cognition  Cognition Arousal/Alertness: Awake/alert Behavior During Therapy: WFL for tasks assessed/performed Overall Cognitive Status: Within Functional Limits for tasks assessed    Mobility  Bed Mobility Supine to  Sit: 3: Mod assist Details for Bed Mobility Assistance: pt used leg lifter to move each leg, support of each leg to lower to floor. Transfers Sit to Stand: 1: +2 Total assist;From bed;From elevated surface;With upper extremity assist Sit to Stand: Patient Percentage: 60% Stand to Sit: To chair/3-in-1;With upper extremity assist;With armrests Details for Transfer Assistance: cues for walk legs forward and backwards after reaching for chair arms/3:1 commode.  Used bil KIs    Exercises     Balance     End of Session OT - End of Session Activity Tolerance: Patient tolerated treatment well Patient left: in chair;with call bell/phone within reach  GO     April Bray 07/22/2012, 11:15 AM Marica Otter, OTR/L [phone] 07/22/2012

## 2012-07-23 DIAGNOSIS — E119 Type 2 diabetes mellitus without complications: Secondary | ICD-10-CM | POA: Diagnosis present

## 2012-07-23 LAB — GLUCOSE, CAPILLARY
Glucose-Capillary: 198 mg/dL — ABNORMAL HIGH (ref 70–99)
Glucose-Capillary: 206 mg/dL — ABNORMAL HIGH (ref 70–99)

## 2012-07-23 MED ORDER — RIVAROXABAN 10 MG PO TABS: 10.0000 mg | ORAL_TABLET | ORAL | Status: DC

## 2012-07-23 MED ORDER — ASPIRIN EC 325 MG PO TBEC: 325.0000 mg | DELAYED_RELEASE_TABLET | Freq: Two times a day (BID) | ORAL | Status: AC

## 2012-07-23 MED ORDER — TIZANIDINE HCL 4 MG PO CAPS: 4.0000 mg | ORAL_CAPSULE | Freq: Three times a day (TID) | ORAL | Status: DC | PRN

## 2012-07-23 MED ORDER — POLYETHYLENE GLYCOL 3350 17 G PO PACK: 17.0000 g | PACK | Freq: Two times a day (BID) | ORAL | Status: DC

## 2012-07-23 MED ORDER — FERROUS SULFATE 325 (65 FE) MG PO TABS: 325.0000 mg | ORAL_TABLET | Freq: Three times a day (TID) | ORAL | Status: DC

## 2012-07-23 MED ORDER — DSS 100 MG PO CAPS: 100.0000 mg | ORAL_CAPSULE | Freq: Two times a day (BID) | ORAL | Status: AC

## 2012-07-23 MED ORDER — HYDROCODONE-ACETAMINOPHEN 7.5-325 MG PO TABS: 1.0000 | ORAL_TABLET | ORAL | Status: DC

## 2012-07-23 NOTE — Progress Notes (Signed)
   Subjective: 3 Days Post-Op Procedure(s) (LRB): TOTAL KNEE BILATERAL (Bilateral)   Patient reports pain as mild, pain well controlled. No events throughout the night. Ready to be discharged to SNF.  Objective:   VITALS:   Filed Vitals:   07/23/12 0552  BP: 142/77  Pulse: 66  Temp: 98.2 F (36.8 C)  Resp: 14    Neurovascular intact Dorsiflexion/Plantar flexion intact Incision: dressing C/D/I No cellulitis present Compartment soft  LABS  Recent Labs  07/21/12 0534 07/22/12 0447  HGB 9.8* 10.2*  HCT 29.3* 30.4*  WBC 8.9 8.5  PLT 172 180     Recent Labs  07/21/12 0534 07/22/12 0447  NA 134* 135  K 4.0 4.3  BUN 13 8  CREATININE 0.59 0.57  GLUCOSE 176* 255*     Assessment/Plan: 3 Days Post-Op Procedure(s) (LRB): TOTAL KNEE BILATERAL (Bilateral) Up with therapy Discharge to SNF Follow up in 2 weeks at [facility]. Follow up with OLIN,Venna Berberich D in 2 weeks.  Contact information:  [facility] [address], [address] [place] [address] [phone]    Expected ABLA  Treated with iron and will observe   Obese (BMI 30-39.9)  Estimated body mass index is 39.61 kg/(m^2) as calculated from the following:      Height as of this encounter: 5\' 5"  (1.651 m).      Weight as of this encounter: 107.956 kg (238 lb).  Patient also counseled that weight may inhibit the healing process  Patient counseled that losing weight will help with future health issues       Anastasio Auerbach. Venecia Mehl   PAC  07/23/2012, 7:21 AM

## 2012-07-23 NOTE — Progress Notes (Signed)
Physical Therapy Treatment Patient Details Name: April Bray MRN: [id number] DOB: 26-Apr-1955 Today's Date: 07/23/2012 Time: 7829-5621 PT Time Calculation (min): 30 min  PT Assessment / Plan / Recommendation  PT Comments   Improving strength and ROM each knee. Pt is planning SNF. Will return for ambulation after rest period from There ex.   Follow Up Recommendations  SNF     Does the patient have the potential to tolerate intense rehabilitation     Barriers to Discharge        Equipment Recommendations       Recommendations for Other Services    Frequency 7X/week   Progress towards PT Goals Progress towards PT goals: Progressing toward goals  Plan Current plan remains appropriate    Precautions / Restrictions Precautions Precautions: Knee Required Braces or Orthoses: Knee Immobilizer - Right;Knee Immobilizer - Left Knee Immobilizer - Right: Discontinue once straight leg raise with < 10 degree lag Restrictions Weight Bearing Restrictions: No   Pertinent Vitals/Pain Sore knees.premedicated.   Mobility       Exercises Total Joint Exercises Quad Sets: AROM;Both;15 reps Short Arc QuadBarbaraann Boys;Both;15 reps Heel Slides: AAROM;Both;15 reps Straight Leg Raises: AAROM;Both;15 reps Goniometric ROM: R=10-50; L=10-40   PT Diagnosis:    PT Problem List:   PT Treatment Interventions:     PT Goals (current goals can now be found in the care plan section)    Visit Information  Last PT Received On: 07/23/12 Assistance Needed: +2 History of Present Illness: pt is 57 yo admitted 07/20/12 for bilateral TKA.     Subjective Data      Cognition  Cognition Arousal/Alertness: Awake/alert    Balance     End of Session PT - End of Session Activity Tolerance: Patient tolerated treatment well Patient left: in bed;with call bell/phone within reach   GP     [name] 07/23/2012, 9:36 AM

## 2012-07-23 NOTE — Progress Notes (Signed)
Clinical Social Work  CSW faxed DC summary to [facility] who is agreeable to admission today. CSW informed patient, husband and RN of DC and all parties agreeable. CSW prepared DC packet and coordinated transportation via [facility] for 1245 pick up. Request [phone]. CSW is signing off but available if needed.  Unk Lightning, LCSW (Coverage for [employer])

## 2012-07-23 NOTE — Progress Notes (Signed)
Physical Therapy Treatment Patient Details Name: April Bray MRN: [id number] DOB: [birth year] Today's Date: 07/23/2012 Time: 6295-2841 PT Time Calculation (min): 19 min  PT Assessment / Plan / Recommendation  PT Comments   Pt continues to progress well with mobility and ambulation. Going to rehab today.  Follow Up Recommendations  SNF     Does the patient have the potential to tolerate intense rehabilitation     Barriers to Discharge        Equipment Recommendations  Rolling walker with 5" wheels    Recommendations for Other Services    Frequency 7X/week   Progress towards PT Goals Progress towards PT goals: Progressing toward goals  Plan Current plan remains appropriate    Precautions / Restrictions Precautions Precautions: Knee Required Braces or Orthoses: Knee Immobilizer - Right;Knee Immobilizer - Left Knee Immobilizer - Right: Discontinue once straight leg raise with < 10 degree lag Restrictions Weight Bearing Restrictions: No   Pertinent Vitals/Pain     Mobility  Bed Mobility Supine to Sit: 5: Supervision;HOB elevated;With rails Details for Bed Mobility Assistance: pt has bil. ki in place. Pt able to move to sitting at the edge of the bed with no assist and did not use leg lifter Transfers Sit to Stand: 3: Mod assist;From elevated surface;From bed;With upper extremity assist Stand to Sit: 4: Min assist;To chair/3-in-1;With upper extremity assist;With armrests Details for Transfer Assistance: assist to get upper body over feet and walk legs back. Ambulation/Gait Ambulation/Gait Assistance: 3: Mod assist Ambulation Distance (Feet): 55 Feet Assistive device: Rolling walker Ambulation/Gait Assistance Details: increased time, L kee buckled slightly inside KI. Pt mildly off balance. Gait Pattern: Step-to pattern;Decreased step length - right;Decreased step length - left Gait velocity: decreased    Exercises Total Joint Exercises Quad Sets: AROM;Both;15 reps Short  Arc QuadBarbaraann Bray;Both;15 reps Heel Slides: AAROM;Both;15 reps Straight Leg Raises: AAROM;Both;15 reps Goniometric ROM: R=10-50; L=10-40   PT Diagnosis:    PT Problem List:   PT Treatment Interventions:     PT Goals (current goals can now be found in the care plan section)    Visit Information  Last PT Received On: 07/23/12 Assistance Needed: +1 History of Present Illness: pt is 57 yo admitted 07/20/12 for bilateral TKA.     Subjective Data   I am feeling better about this,   Cognition  Cognition Arousal/Alertness: Awake/alert    Balance     End of Session PT - End of Session Equipment Utilized During Treatment: Right knee immobilizer;Left knee immobilizer Activity Tolerance: Patient tolerated treatment well Patient left: in chair;with call bell/phone within reach;with family/visitor present Nurse Communication: Mobility status   GP     April Bray 07/23/2012, 11:07 AM

## 2012-07-23 NOTE — Discharge Summary (Signed)
Physician Discharge Summary  Patient ID: April Bray MRN: [id number] DOB/AGE: Jun 17, 1955 57 y.o.  Admit date: 07/20/2012 Discharge date:  07/23/2012  Procedures:  Procedure(s) (LRB): TOTAL KNEE BILATERAL (Bilateral)  Attending Physician:  Dr. Durene Romans   Admission Diagnoses:   Bilaterally knee OA / pain  Discharge Diagnoses:  Principal Problem:   S/P Bilateral TKAs Active Problems:   Expected blood loss anemia   Obese  Past Medical History  Diagnosis Date  . Allergy   . Anxiety   . Arthritis   . Diabetes mellitus   . Hypertension   . Hyperlipidemia   . Bilateral dry eyes 07-14-12    allery and dry eyes    HPI: April Bray, 57 y.o. female, has a history of pain and functional disability in the bilaterally knees due to arthritis and has failed non-surgical conservative treatments for greater than 12 weeks to includeNSAID's and/or analgesics and activity modification. Onset of symptoms was gradual, starting 3 years ago with gradually worsening course since that time. The patient noted prior procedures on the knee to include arthroscopy on the right knee(s). Patient currently rates pain in the bilaterally knee(s) at 10 out of 10 with activity. Patient has night pain, worsening of pain with activity and weight bearing, pain that interferes with activities of daily living, pain with passive range of motion, crepitus and joint swelling. Patient has evidence of periarticular osteophytes and joint space narrowing by imaging studies. There is no active signs of infection. Risks, benefits and expectations were discussed with the patient. Patient understand the risks, benefits and expectations and wishes to proceed with surgery.   PCP: April Cirri, DO   Discharged Condition: good  Hospital Course:  Patient underwent the above stated procedure on 07/20/2012. Patient tolerated the procedure well and brought to the recovery room in good condition and subsequently to the  floor.  POD #1 BP: 106/67 ; Pulse: 62 ; Temp: 97.6 F (36.4 C) ; Resp: 21  Hemovac drain removed. IV was changed to a saline lock. Patient reports pain as mild, pain well controlled. Epidural still in place. Able to lift the right leg, little slow on lifting the left leg. No events throughout the night.  Neurovascular intact, dorsiflexion/plantar flexion intact, incision: dressing C/D/I, no cellulitis present and compartment soft.   LABS  Basename  07/21/12    0534  HGB  9.8  HCT  29.3   POD #2  BP: 131/70 ; Pulse: 64 ; Temp: 97.7 F (36.5 C) ; Resp: 18  Pt's foley was removed, as well as the epidural. Patient reports pain as mild, pain well controlled. She did have a spike of pain if difficulty controlling the pain once the epidural was removed. Nurses yesterday and throughout the night eventually got it under control. She is much more comfortable this morning. She feels that she would benefit from a SNF, to assist her until she gets up to speed. Neurovascular intact, dorsiflexion/plantar flexion intact, incision: dressing C/D/I, no cellulitis present and compartment soft.   LABS  Basename  07/22/12    0447  HGB  10.2  HCT  30.4   POD #3  BP: 142/77 ; Pulse: 66 ; Temp: 98.2 F (36.8 C) ; Resp: 14  Patient reports pain as mild, pain well controlled. No events throughout the night. Ready to be discharged to SNF. Neurovascular intact, dorsiflexion/plantar flexion intact, incision: dressing C/D/I, no cellulitis present and compartment soft.   LABS   No new labs  Discharge Exam:  General appearance: alert, cooperative and no distress Extremities: Homans sign is negative, no sign of DVT, no edema, redness or tenderness in the calves or thighs and no ulcers, gangrene or trophic changes  Disposition:  SNF with follow up in 2 weeks   Follow-up Information   Follow up with April Pal, MD. Schedule an appointment as soon as possible for a visit in 2 weeks.   Contact information:    [address] [place] [address] [phone]       Discharge Orders   Future Orders Complete By Expires     Call MD / Call 911  As directed     Comments:      If you experience chest pain or shortness of breath, CALL 911 and be transported to the hospital emergency room.  If you develope a fever above 101 F, pus (white drainage) or increased drainage or redness at the wound, or calf pain, call your surgeon's office.    Change dressing  As directed     Comments:      Maintain surgical dressing for 10-14 days, then change the dressing daily with sterile 4 x 4 inch gauze dressing and tape. Keep the area dry and clean.    Constipation Prevention  As directed     Comments:      Drink plenty of fluids.  Prune juice may be helpful.  You may use a stool softener, such as Colace (over the counter) 100 mg twice a day.  Use MiraLax (over the counter) for constipation as needed.    Diet - low sodium heart healthy  As directed     Discharge instructions  As directed     Comments:      Maintain surgical dressing for 10-14 days, then replace with gauze and tape. Keep the area dry and clean until follow up. Follow up in 2 weeks at [facility]. Call with any questions or concerns.    Driving restrictions  As directed     Comments:      No driving for 4 weeks    Increase activity slowly as tolerated  As directed     TED hose  As directed     Comments:      Use stockings (TED hose) for 2 weeks on both leg(s).  You may remove them at night for sleeping.    Weight bearing as tolerated  As directed          Medication List    STOP taking these medications       aspirin 325 MG tablet  Replaced by:  aspirin EC 325 MG tablet      TAKE these medications       aspirin EC 325 MG tablet  Take 1 tablet (325 mg total) by mouth 2 (two) times daily. Take for 4 weeks.  Start taking on:  08/04/2012     atorvastatin 10 MG tablet  Commonly known as:  LIPITOR  Take 10 mg  by mouth daily with supper.     CALTRATE 600+D PO  Take 1 capsule by mouth daily.     cetirizine 10 MG tablet  Commonly known as:  ZYRTEC  Take 10 mg by mouth daily.     cholecalciferol 1000 UNITS tablet  Commonly known as:  VITAMIN D  Take 1,000 Units by mouth daily.     Cinnamon 500 MG capsule  Take 100 mg by mouth daily.     DSS 100 MG Caps  Take 100 mg by mouth 2 (two) times daily.     exenatide 10 MCG/0.04ML Sopn  Commonly known as:  BYETTA  Inject 10 mcg into the skin 2 (two) times daily with a meal.     ferrous sulfate 325 (65 FE) MG tablet  Take 1 tablet (325 mg total) by mouth 3 (three) times daily after meals.     GREEN COFFEE BEAN EXTRACT PO  Take 800 mg by mouth daily.     HYDROcodone-acetaminophen 7.5-325 MG per tablet  Commonly known as:  NORCO  Take 1-2 tablets by mouth every 4 (four) hours.     Krill Oil 500 MG Caps  Take 500 mg by mouth 2 (two) times daily.     LANTUS SOLOSTAR 100 UNIT/ML Sopn  Generic drug:  Insulin Glargine  at bedtime. Takes 20 to 25 units at bedtime.     lisinopril 10 MG tablet  Commonly known as:  PRINIVIL,ZESTRIL  Take 10 mg by mouth daily with supper.     metFORMIN 1000 MG tablet  Commonly known as:  GLUCOPHAGE  Take 1,000 mg by mouth 2 (two) times daily with a meal.     multivitamin tablet  Take 2 tablets by mouth 2 (two) times daily.     polyethylene glycol packet  Commonly known as:  MIRALAX / GLYCOLAX  Take 17 g by mouth 2 (two) times daily.     rivaroxaban 10 MG Tabs tablet  Commonly known as:  XARELTO  Take 1 tablet (10 mg total) by mouth daily.     sertraline 25 MG tablet  Commonly known as:  ZOLOFT  Take 25 mg by mouth every morning.     tiZANidine 4 MG capsule  Commonly known as:  ZANAFLEX  Take 1 capsule (4 mg total) by mouth 3 (three) times daily as needed for muscle spasms.     vitamin C 500 MG tablet  Commonly known as:  ASCORBIC ACID  Take 500 mg by mouth daily.     ZADITOR 0.025 % ophthalmic  solution  Generic drug:  ketotifen  Place 1 drop into both eyes 2 (two) times daily as needed.         Signed: Anastasio Bray. April Bray   PAC  07/23/2012, 7:33 AM

## 2012-07-23 NOTE — Progress Notes (Signed)
07/23/2012 Colleen Can BSN RN CCM [phone] DISCHARGE PLAN IS NOW SNF REHAB. REFERRAL TO CSW. DISCHARGE ANTICIPATED TODAY TO SNF.

## 2012-07-23 NOTE — Progress Notes (Signed)
Report attempted two times. Busy signal both times, after ringing for awhile. Patient is in excellent condition, s/p B TKA. Pain minimal, medicated at 1135. Aquacel dressings x2 Clean dry and intact to anterior bilateral knees. VSS, no complaints. Up to chair with therapy this morning and walked in hall. .continuing to monitor until discharge. Family sitting with patient. April Bray [place], [place] 07/23/2012 11:50 AM

## 2012-07-30 ENCOUNTER — Non-Acute Institutional Stay (SKILLED_NURSING_FACILITY): Payer: PRIVATE HEALTH INSURANCE | Admitting: Internal Medicine

## 2012-07-30 DIAGNOSIS — I10 Essential (primary) hypertension: Secondary | ICD-10-CM

## 2012-07-30 DIAGNOSIS — D500 Iron deficiency anemia secondary to blood loss (chronic): Secondary | ICD-10-CM

## 2012-07-30 DIAGNOSIS — E119 Type 2 diabetes mellitus without complications: Secondary | ICD-10-CM

## 2012-08-05 ENCOUNTER — Non-Acute Institutional Stay (SKILLED_NURSING_FACILITY): Payer: PRIVATE HEALTH INSURANCE | Admitting: Adult Health

## 2012-08-05 ENCOUNTER — Encounter: Payer: Self-pay | Admitting: Adult Health

## 2012-08-05 DIAGNOSIS — K5900 Constipation, unspecified: Secondary | ICD-10-CM

## 2012-08-05 DIAGNOSIS — Z96659 Presence of unspecified artificial knee joint: Secondary | ICD-10-CM

## 2012-08-05 DIAGNOSIS — I10 Essential (primary) hypertension: Secondary | ICD-10-CM

## 2012-08-05 DIAGNOSIS — E785 Hyperlipidemia, unspecified: Secondary | ICD-10-CM | POA: Insufficient documentation

## 2012-08-05 DIAGNOSIS — E119 Type 2 diabetes mellitus without complications: Secondary | ICD-10-CM

## 2012-08-05 DIAGNOSIS — Z96653 Presence of artificial knee joint, bilateral: Secondary | ICD-10-CM

## 2012-08-05 DIAGNOSIS — D500 Iron deficiency anemia secondary to blood loss (chronic): Secondary | ICD-10-CM

## 2012-08-05 DIAGNOSIS — F329 Major depressive disorder, single episode, unspecified: Secondary | ICD-10-CM

## 2012-08-05 NOTE — Progress Notes (Signed)
  Subjective:    Patient ID: April Bray, female    DOB: November 23, 1955, 57 y.o.   MRN: [id number]  HPI This is a 57 year old female who is for discharge home with medications and home health PT. She has been admitted to [facility] place on 07/23/12 from [facility] with end-stage arthritis status post bilateral total knee arthroplasties. Patient has completed SNF rehabilitation and therapy has cleared the patient for discharge.   Review of Systems  Constitutional: Negative.   HENT: Negative.   Eyes: Negative.   Respiratory: Negative for cough and shortness of breath.   Cardiovascular: Negative for leg swelling.  Gastrointestinal: Negative for abdominal pain and abdominal distention.  Endocrine: Negative.   Genitourinary: Negative.   Neurological: Negative.   Hematological: Negative for adenopathy. Does not bruise/bleed easily.  Psychiatric/Behavioral: Negative.        Objective:   Physical Exam  Nursing note and vitals reviewed. Constitutional: She is oriented to person, place, and time. She appears well-developed and well-nourished.  HENT:  Head: Normocephalic and atraumatic.  Right Ear: External ear normal.  Left Ear: External ear normal.  Nose: Nose normal.  Mouth/Throat: Oropharynx is clear and moist.  Eyes: Conjunctivae and EOM are normal. Pupils are equal, round, and reactive to light.  Neck: Normal range of motion. Neck supple.  Cardiovascular: Normal rate, regular rhythm, normal heart sounds and intact distal pulses.   Pulmonary/Chest: Effort normal and breath sounds normal.  Abdominal: Soft. Bowel sounds are normal.  Musculoskeletal: Normal range of motion. She exhibits no edema and no tenderness.  Neurological: She is alert and oriented to person, place, and time.  Skin: Skin is warm and dry.  Psychiatric: She has a normal mood and affect. Her behavior is normal. Judgment and thought content normal.     LABS/PROCEDURES: 07/27/12 WBC 7.5 hemoglobin 9.6  hematocrit 28.8 sodium 136 potassium 4.4 glucose 227 BUN 11 creatinine 0.45 calcium 9.0       Medications reviewed.    Assessment & Plan:   Hypertension -  well controlled   Constipation - stable   Depression - stable   Hyperlipidemia - stable   Diabetes - well controlled   Expected blood loss anemia - stable  S/P Bilateral TKAs  - for home health PT     Total discharge time: >30 minutes Discharge time involved coordination of the discharge process we have the social worker, nursing staff and therapy department. Medical justification for home health services verified.    CPT CODE: 40981

## 2012-08-24 NOTE — Progress Notes (Signed)
Patient ID: April Bray, female   DOB: 1955-03-12, 57 y.o.   MRN: [id number]        HISTORY & PHYSICAL  DATE: 07/30/2012   FACILITY: [facility]  LEVEL OF CARE: SNF (31)  ALLERGIES:  No Known Allergies  CHIEF COMPLAINT:  Manage bilateral knee osteoarthritis, acute blood loss anemia, and diabetes mellitus.    HISTORY OF PRESENT ILLNESS:  The patient is a 57 year-old, Caucasian female.    KNEE OSTEOARTHRITIS: Patient had a history of pain and functional disability in the knees due to end-stage osteoarthritis and has failed nonsurgical conservative treatments. Patient had worsening of pain with activity and weight bearing, pain that interfered with activities of daily living & pain with passive range of motion. Therefore patient underwent bilateral total knee arthroplasty and tolerated the procedure well. Patient is admitted to this facility for sort short-term rehabilitation. Patient denies knee pain.   ANEMIA: Postoperatively, patient suffered acute blood loss.  The anemia is unstable. The patient denies fatigue, melena or hematochezia. No complications from the medications currently being used.  Last hemoglobin level was 9.6.  Prior hemoglobins were 9.8 and 10.2.    DM:pt's DM remains stable.  Pt denies polyuria, polydipsia, polyphagia, changes in vision or hypoglycemic episodes.  No complications noted from the medication presently being used.  Last hemoglobin A1c is: Not available.  PAST MEDICAL HISTORY :  Past Medical History  Diagnosis Date  . Allergy   . Anxiety   . Arthritis   . Diabetes mellitus   . Hypertension   . Hyperlipidemia   . Bilateral dry eyes 07-14-12    allery and dry eyes    PAST SURGICAL HISTORY: Past Surgical History  Procedure Laterality Date  . Knee arthroscopy      right  . Tubal ligation    . Ovarion cyst      bilateral  . Cholecystectomy    . Urethral sling    . Total knee arthroplasty Bilateral 07/20/2012    Procedure: TOTAL  KNEE BILATERAL;  Surgeon: Shelda Pal, MD;  Location: [facility];  Service: Orthopedics;  Laterality: Bilateral;    SOCIAL HISTORY:  reports that she has never smoked. She does not have any smokeless tobacco history on file. She reports that she does not drink alcohol or use illicit drugs.  FAMILY HISTORY:  Family History  Problem Relation Age of Onset  . Colon polyps Father   . Colon cancer Maternal Grandmother   . Colon cancer Maternal Grandfather     CURRENT MEDICATIONS: Reviewed per [facility]  REVIEW OF SYSTEMS:  See HPI otherwise 14 point ROS is negative.  PHYSICAL EXAMINATION  VS:  T 96.4       P 87      RR 14    BP 146/83      POX 98% room air        WT (Lb)  GENERAL: no acute distress, moderately obese body habitus EYES: conjunctivae normal, sclerae normal, normal eye lids MOUTH/THROAT: lips without lesions,no lesions in the mouth,tongue is without lesions,uvula elevates in midline NECK: supple, trachea midline, no neck masses, no thyroid tenderness, no thyromegaly LYMPHATICS: no LAN in the neck, no supraclavicular LAN RESPIRATORY: breathing is even & unlabored, BS CTAB CARDIAC: RRR, no murmur,no extra heart sounds EDEMA/VARICOSITIES:  +2 bilateral lower extremity edema  ARTERIAL:  pedal pulses nonpalpable  GI:  ABDOMEN: abdomen soft, normal BS, no masses, no tenderness  LIVER/SPLEEN: no hepatomegaly, no splenomegaly MUSCULOSKELETAL: HEAD:  normal to inspection & palpation BACK: no kyphosis, scoliosis or spinal processes tenderness EXTREMITIES: LEFT UPPER EXTREMITY: full range of motion, normal strength & tone RIGHT UPPER EXTREMITY:  full range of motion, normal strength & tone LEFT LOWER EXTREMITY: strength intact, range of motion not tested RIGHT LOWER EXTREMITY: strength intact, range of motion not tested PSYCHIATRIC: the patient is alert & oriented to person, affect & behavior appropriate  LABS/RADIOLOGY: Hemoglobin 9.6, MCV 82.1, otherwise CBC normal.   Glucose  227, otherwise BMP normal.  ASSESSMENT/PLAN:  Bilateral knee osteoarthritis.  Status post bilateral total knee arthroplasty.  Continue rehabilitation.   Acute blood loss anemia.  Hemoglobin declined.  Continue iron.  Recheck in two weeks pending.   Diabetes mellitus.  Continue current medications.   Hypertension.  Uncontrolled.  Increase lisinopril to 20 mg q.d.    V58.69.  Check BMP on 08/03/2012.    Allergic rhinitis.  Continue current medications.    Constipation.  Well controlled.   I have reviewed patient's medical records received at admission/from hospitalization.  CPT CODE: 16109

## 2012-08-25 DIAGNOSIS — M1710 Unilateral primary osteoarthritis, unspecified knee: Secondary | ICD-10-CM | POA: Insufficient documentation

## 2012-09-02 ENCOUNTER — Other Ambulatory Visit: Payer: Self-pay | Admitting: Adult Health

## 2013-09-03 ENCOUNTER — Encounter: Payer: Self-pay | Admitting: Gastroenterology

## 2015-02-15 IMAGING — CR DG CHEST 2V
2 series · 2 of 2 positions shown · non-contrast
Comparison: None.

CLINICAL DATA: Hypertension

CHEST - 2 VIEW

[w chest pa]
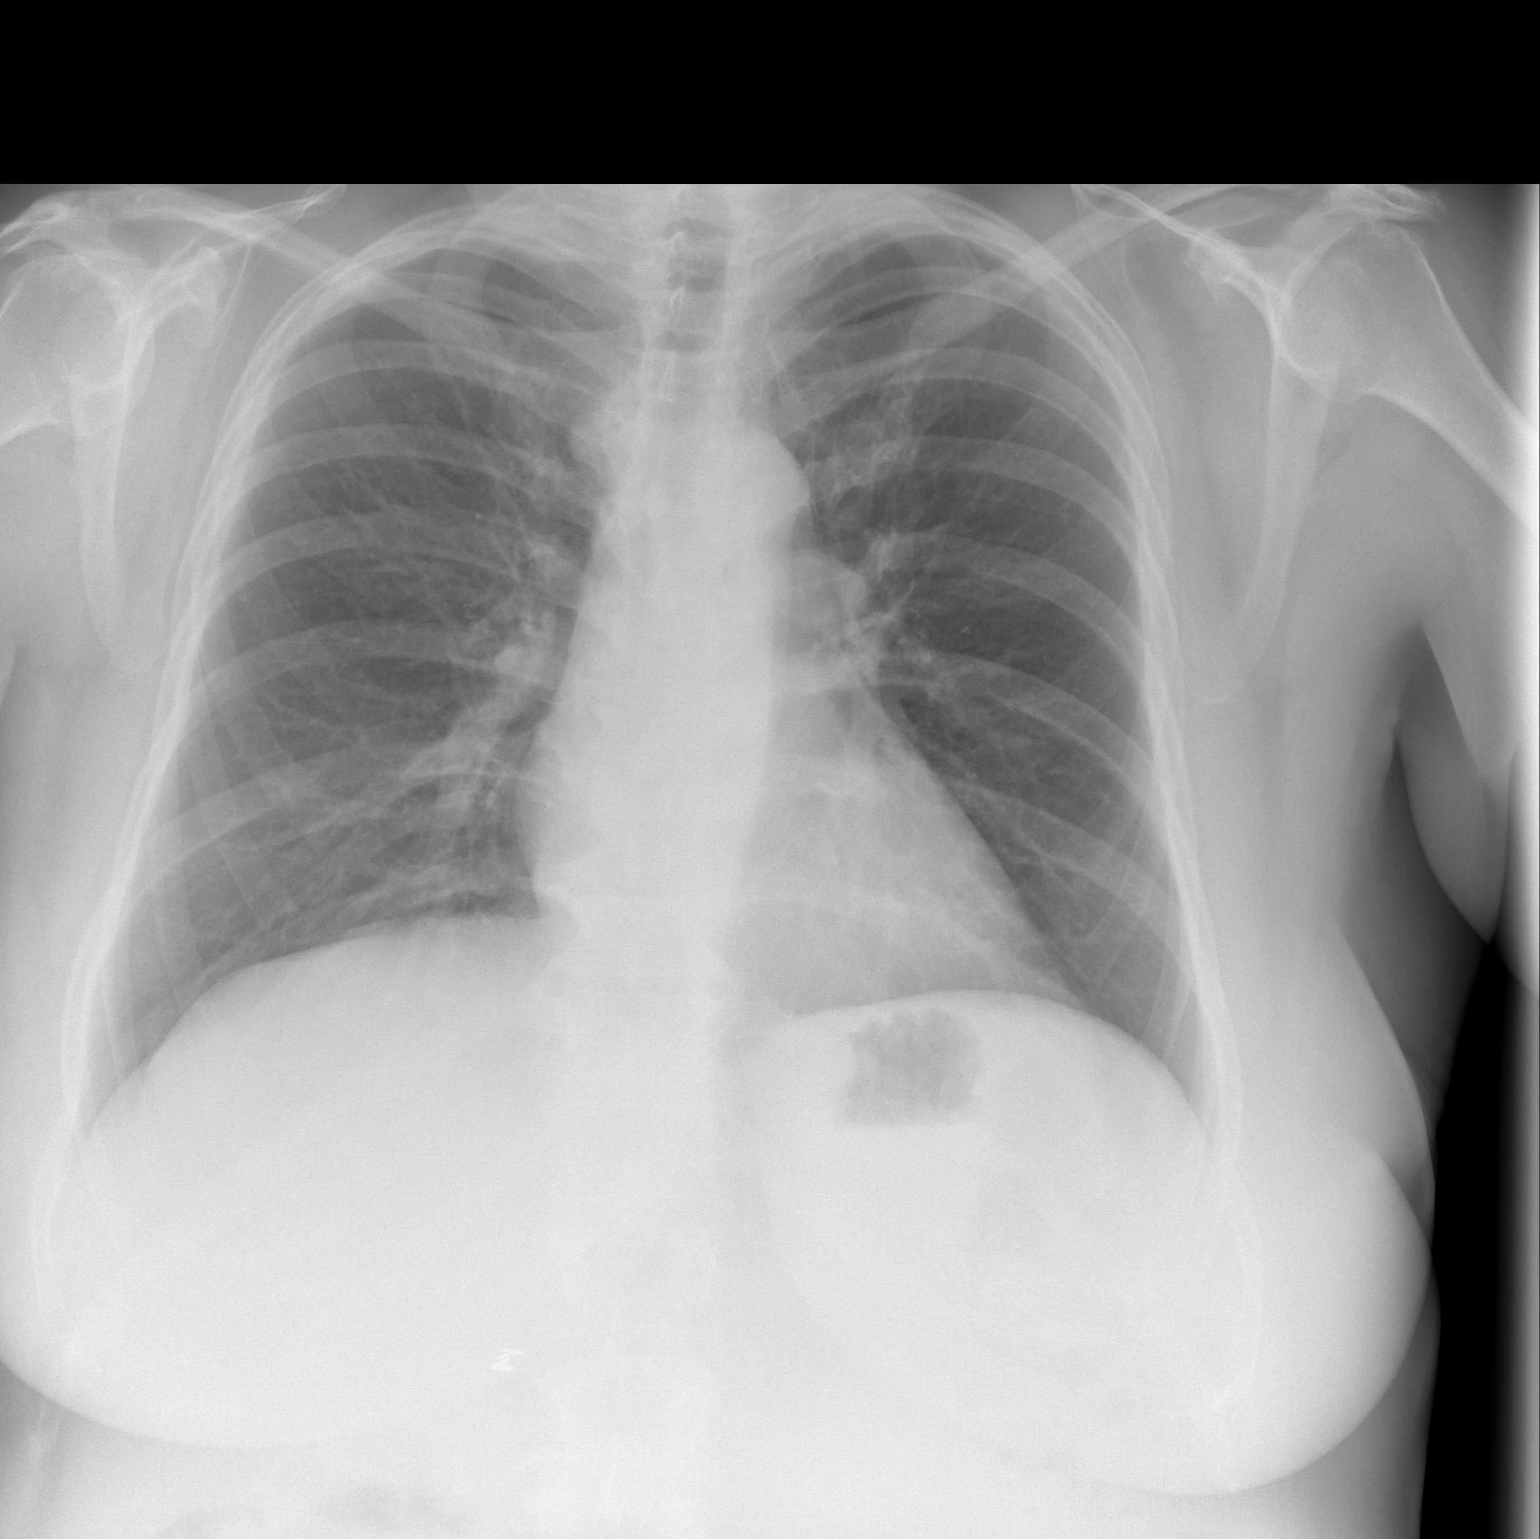

[w chest lat]
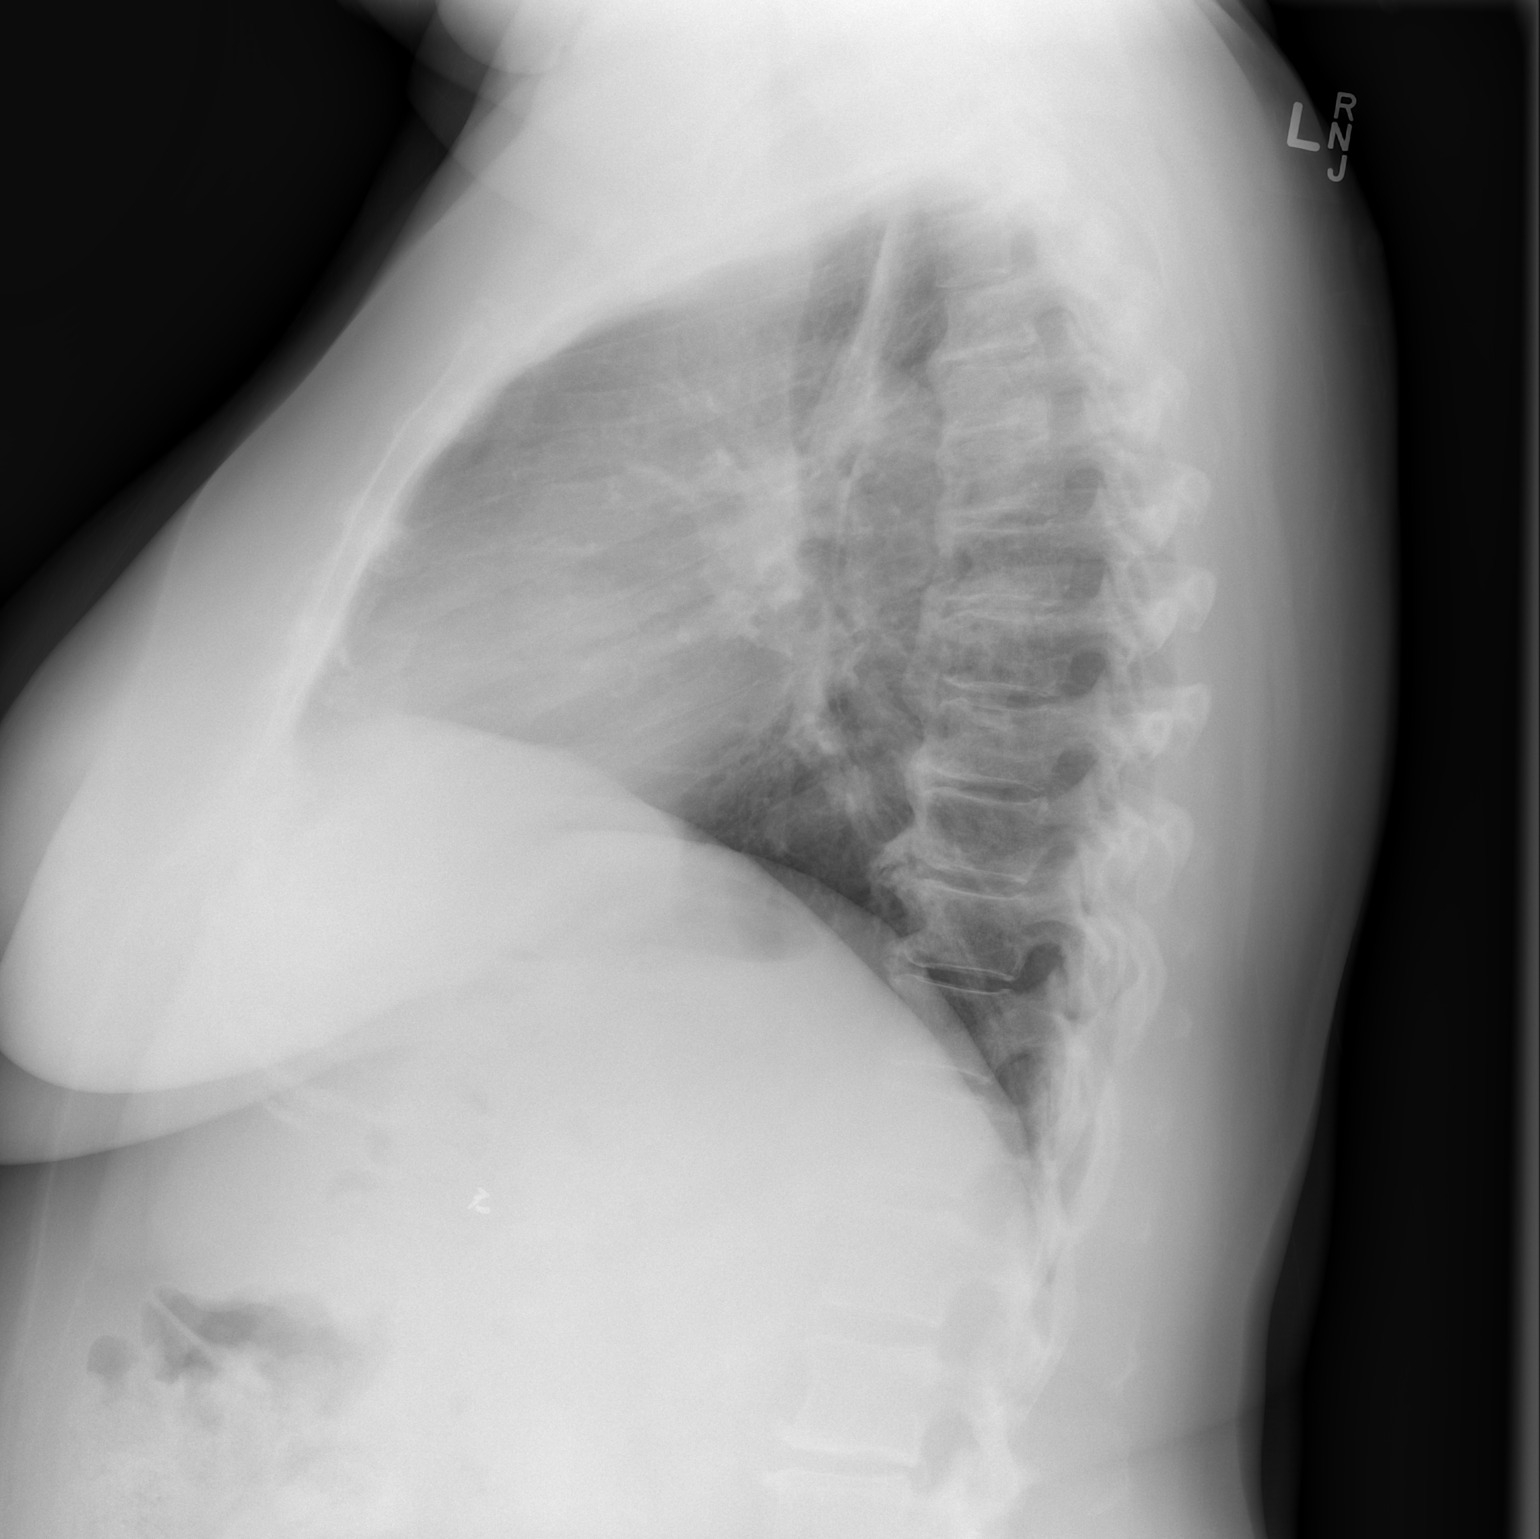

[2 of 2 positions shown; findings below may reference images not displayed]

FINDINGS: Cardiomediastinal silhouette appears normal.  No acute
pulmonary disease is noted.  Bony thorax is intact.
IMPRESSION: No acute cardiopulmonary abnormality seen.

## 2015-05-29 ENCOUNTER — Encounter: Payer: Self-pay | Admitting: Gastroenterology

## 2019-06-23 ENCOUNTER — Encounter: Payer: Self-pay | Admitting: Gastroenterology

## 2020-01-22 HISTORY — PX: OTHER SURGICAL HISTORY: SHX169

## 2020-12-07 ENCOUNTER — Encounter: Payer: Self-pay | Admitting: Gastroenterology

## 2020-12-22 ENCOUNTER — Encounter: Payer: Self-pay | Admitting: Gastroenterology

## 2020-12-22 ENCOUNTER — Ambulatory Visit (INDEPENDENT_AMBULATORY_CARE_PROVIDER_SITE_OTHER): Payer: Medicare Other | Admitting: Gastroenterology

## 2020-12-22 VITALS — BP 130/60 | HR 53 | Ht 65.0 in | Wt 246.4 lb

## 2020-12-22 DIAGNOSIS — Z860100 Personal history of colon polyps, unspecified: Secondary | ICD-10-CM | POA: Insufficient documentation

## 2020-12-22 MED ORDER — NA SULFATE-K SULFATE-MG SULF 17.5-3.13-1.6 GM/177ML PO SOLN: 1.0000 | Freq: Once | ORAL | 0 refills | Status: AC

## 2020-12-22 NOTE — Patient Instructions (Signed)
You have been scheduled for a colonoscopy. Please follow written instructions given to you at your visit today.  Please pick up your prep supplies at the pharmacy within the next 1-3 days. If you use inhalers (even only as needed), please bring them with you on the day of your procedure.  If you are age 66 or older, your body mass index should be between 23-30. Your Body mass index is 41 kg/m. If this is out of the aforementioned range listed, please consider follow up with your Primary Care Provider.  If you are age 37 or younger, your body mass index should be between 19-25. Your Body mass index is 41 kg/m. If this is out of the aformentioned range listed, please consider follow up with your Primary Care Provider.   ________________________________________________________  The [facility] providers would like to encourage you to use [facility] to communicate with providers for non-urgent requests or questions.  Due to long hold times on the telephone, sending your provider a message by [facility] may be a faster and more efficient way to get a response.  Please allow 48 business hours for a response.  Please remember that this is for non-urgent requests.  _______________________________________________________

## 2020-12-22 NOTE — Progress Notes (Signed)
12/22/2020 April Bray [id number] [birth year]   HISTORY OF PRESENT ILLNESS: This is a 65 year old female who was previously a patient of Dr. Lynne Leader and then had a colonoscopy by Dr. Lyndel Safe in 2017 while he was still practicing in [place].  She has now come back here again to discuss colonoscopy.  She has had some polyps as listed below.  Also reports family history of colon polyps in her father and 1 or 2 second-degree relatives with history of colon cancer.  She denies any issues with moving her bowels.  No rectal bleeding.  She reports that she has had some mild acid reflux issues recently and she has been taking omeprazole on an as-needed basis, which is working well.  Colonoscopy 09/2004 showed colon polys and diverticulosis--polyps were c/w inflammatory polyps.  Colonoscopy 07/2010 showed one polyp that was removed and was hyperplastic.  Colonoscopy 06/2015 with Dr. Lyndel Safe showed diverticulosis in the sigmoid colon, small internal hemorrhoids, and two polyps that were removed (6 mm in size).  Tubular adenoma x 2.   Past Medical History:  Diagnosis Date   Allergy    Anxiety    Arthritis    Bilateral dry eyes 07-14-12   allery and dry eyes   Diabetes mellitus    Hyperlipidemia    Hypertension    Past Surgical History:  Procedure Laterality Date   cateract surgery Bilateral 2022   CHOLECYSTECTOMY     KNEE ARTHROSCOPY     right   ovarion cyst     bilateral   TOTAL KNEE ARTHROPLASTY Bilateral 07/20/2012   Procedure: TOTAL KNEE BILATERAL;  Surgeon: Mauri Pole, MD;  Location: [facility];  Service: Orthopedics;  Laterality: Bilateral;   TUBAL LIGATION     URETHRAL SLING      reports that she has never smoked. She does not have any smokeless tobacco history on file. She reports that she does not drink alcohol and does not use drugs. family history includes Colon cancer in her maternal grandfather and maternal grandmother; Colon polyps in her father. No Known Allergies     Outpatient Encounter Medications as of 12/22/2020  Medication Sig   Apple Cider Vinegar 600 MG CAPS Take 1 capsule by mouth daily.   atorvastatin (LIPITOR) 10 MG tablet Take 10 mg by mouth daily with supper.    BLACK CURRANT SEED OIL PO Take by mouth 2 (two) times daily.   Calcium Carbonate-Vitamin D (CALTRATE 600+D PO) Take 1 capsule by mouth daily.     cetirizine (ZYRTEC) 10 MG tablet Take 10 mg by mouth daily.     cholecalciferol (VITAMIN D) 1000 UNITS tablet Take 1,000 Units by mouth daily.   Cinnamon 500 MG capsule Take 100 mg by mouth daily.    co-enzyme Q-10 30 MG capsule Take by mouth 2 (two) times daily.   docusate sodium 100 MG CAPS Take 100 mg by mouth 2 (two) times daily.   GNP GARLIC EXTRACT PO Take 549 mg by mouth 2 (two) times daily.   liraglutide (VICTOZA) 18 MG/3ML SOPN Inject 1.8 mg into the skin daily.   lisinopril (PRINIVIL,ZESTRIL) 10 MG tablet Take 10 mg by mouth daily with supper.    magnesium gluconate (MAGONATE) 500 MG tablet Take 500 mg by mouth 2 (two) times daily.   metFORMIN (GLUCOPHAGE) 1000 MG tablet Take 1,000 mg by mouth 2 (two) times daily with a meal.    Multiple Vitamin (MULTIVITAMIN) tablet Take 2 tablets by mouth 2 (two) times daily.  Na Sulfate-K Sulfate-Mg Sulf 17.5-3.13-1.6 GM/177ML SOLN Take 1 kit by mouth once for 1 dose.   omeprazole (PRILOSEC) 40 MG capsule Take 40 mg by mouth daily as needed.   sertraline (ZOLOFT) 50 MG tablet Take 75 mg by mouth daily.   TRESIBA FLEXTOUCH 200 UNIT/ML FlexTouch Pen Inject 60 Units into the skin at bedtime.   Turmeric Curcumin 500 MG CAPS Take 1 capsule by mouth 2 (two) times daily.   vitamin C (ASCORBIC ACID) 500 MG tablet Take 500 mg by mouth daily.   [DISCONTINUED] exenatide (BYETTA) 10 MCG/0.04ML SOPN Inject 10 mcg into the skin 2 (two) times daily with a meal.   [DISCONTINUED] ferrous sulfate 325 (65 FE) MG tablet Take 1 tablet (325 mg total) by mouth 3 (three) times daily after meals.   [DISCONTINUED]  Nyoka Cowden Coffee Bean-Yerba Mate (GREEN COFFEE BEAN EXTRACT PO) Take 800 mg by mouth daily.   [DISCONTINUED] HYDROcodone-acetaminophen (NORCO) 7.5-325 MG per tablet Take 1-2 tablets by mouth every 4 (four) hours.   [DISCONTINUED] ketotifen (ZADITOR) 0.025 % ophthalmic solution Place 1 drop into both eyes 2 (two) times daily as needed.   [DISCONTINUED] Krill Oil 500 MG CAPS Take 500 mg by mouth 2 (two) times daily.   [DISCONTINUED] LANTUS SOLOSTAR 100 UNIT/ML injection at bedtime. Takes 20 to 25 units at bedtime.   [DISCONTINUED] polyethylene glycol (MIRALAX / GLYCOLAX) packet Take 17 g by mouth 2 (two) times daily.   [DISCONTINUED] rivaroxaban (XARELTO) 10 MG TABS tablet Take 1 tablet (10 mg total) by mouth daily.   [DISCONTINUED] sertraline (ZOLOFT) 25 MG tablet Take 25 mg by mouth every morning.   [DISCONTINUED] tiZANidine (ZANAFLEX) 4 MG capsule Take 1 capsule (4 mg total) by mouth 3 (three) times daily as needed for muscle spasms.   No facility-administered encounter medications on file as of 12/22/2020.    REVIEW OF SYSTEMS  : All other systems reviewed and negative except where noted in the History of Present Illness.   PHYSICAL EXAM: BP 130/60   Pulse (!) 53   Ht 5' 5" (1.651 m)   Wt 246 lb 6 oz (111.8 kg)   BMI 41.00 kg/m  General: Well developed white female in no acute distress Head: Normocephalic and atraumatic Eyes:  Sclerae anicteric, conjunctiva pink. Ears: Normal auditory acuity Lungs: Clear throughout to auscultation; no W/R/R. Heart: Irregular; no M/R/G. Abdomen: Soft, non-distended.  BS present.  Non-tender. Rectal:  Will be done at the time of colonoscopy. Musculoskeletal: Symmetrical with no gross deformities  Skin: No lesions on visible extremities Extremities: No edema  Neurological: Alert oriented x 4, grossly non-focal Psychological:  Alert and cooperative. Normal mood and affect  ASSESSMENT AND PLAN: *Personal history of colon polyps:  Had inflammatory polyp  in 2006, hyperplastic in 2012, and two tubular adenomas in 2017.  Also reports family history of colon polyps in her father and family history of colon cancer in 1 or 2 second-degree relatives.  We will plan for surveillance colonoscopy with Dr. Lyndel Safe.  The risks, benefits, and alternatives to colonoscopy were discussed with the patient and she consents to proceed.  CC:  Karma Greaser, DO

## 2020-12-31 NOTE — Progress Notes (Signed)
Agree with plan RG 

## 2021-02-22 ENCOUNTER — Ambulatory Visit (AMBULATORY_SURGERY_CENTER): Payer: Medicare Other | Admitting: Gastroenterology

## 2021-02-22 ENCOUNTER — Encounter: Payer: Self-pay | Admitting: Gastroenterology

## 2021-02-22 ENCOUNTER — Other Ambulatory Visit: Payer: Self-pay

## 2021-02-22 VITALS — BP 143/68 | HR 64 | Temp 97.5°F | Resp 18 | Ht 65.0 in | Wt 246.0 lb

## 2021-02-22 DIAGNOSIS — Z86010 Personal history of colonic polyps: Secondary | ICD-10-CM

## 2021-02-22 MED ORDER — SODIUM CHLORIDE 0.9 % IV SOLN: 500.0000 mL | Freq: Once | INTRAVENOUS | Status: DC

## 2021-02-22 NOTE — Patient Instructions (Addendum)
Handouts were given to your care partner on diverticulosis and hemorrhoids. Your sugar was 106 in the recovery room. You may resume your current medications today. No repeat colonoscopy unless having symptoms. Please call if any questions or concerns.     YOU HAD AN ENDOSCOPIC PROCEDURE TODAY AT [facility]:   Refer to the procedure report that was given to you for any specific questions about what was found during the examination.  If the procedure report does not answer your questions, please call your gastroenterologist to clarify.  If you requested that your care partner not be given the details of your procedure findings, then the procedure report has been included in a sealed envelope for you to review at your convenience later.  YOU SHOULD EXPECT: Some feelings of bloating in the abdomen. Passage of more gas than usual.  Walking can help get rid of the air that was put into your GI tract during the procedure and reduce the bloating. If you had a lower endoscopy (such as a colonoscopy or flexible sigmoidoscopy) you may notice spotting of blood in your stool or on the toilet paper. If you underwent a bowel prep for your procedure, you may not have a normal bowel movement for a few days.  Please Note:  You might notice some irritation and congestion in your nose or some drainage.  This is from the oxygen used during your procedure.  There is no need for concern and it should clear up in a day or so.  SYMPTOMS TO REPORT IMMEDIATELY:  Following lower endoscopy (colonoscopy or flexible sigmoidoscopy):  Excessive amounts of blood in the stool  Significant tenderness or worsening of abdominal pains  Swelling of the abdomen that is new, acute  Fever of 100F or higher   For urgent or emergent issues, a gastroenterologist can be reached at any hour by calling [phone]. Do not use MyChart messaging for urgent concerns.    DIET:  We do recommend a small meal at first,  but then you may proceed to your regular diet.  Drink plenty of fluids but you should avoid alcoholic beverages for 24 hours.  ACTIVITY:  You should plan to take it easy for the rest of today and you should NOT DRIVE or use heavy machinery until tomorrow (because of the sedation medicines used during the test).    FOLLOW UP: Our staff will call the number listed on your records 48-72 hours following your procedure to check on you and address any questions or concerns that you may have regarding the information given to you following your procedure. If we do not reach you, we will leave a message.  We will attempt to reach you two times.  During this call, we will ask if you have developed any symptoms of COVID 19. If you develop any symptoms (ie: fever, flu-like symptoms, shortness of breath, cough etc.) before then, please call [phone].  If you test positive for Covid 19 in the 2 weeks post procedure, please call and report this information to [place].    If any biopsies were taken you will be contacted by phone or by letter within the next 1-3 weeks.  Please call us at [phone] if you have not heard about the biopsies in 3 weeks.    SIGNATURES/CONFIDENTIALITY: You and/or your care partner have signed paperwork which will be entered into your electronic medical record.  These signatures attest to the fact that that the information above on your After  Visit Summary has been reviewed and is understood.  Full responsibility of the confidentiality of this discharge information lies with you and/or your care-partner.

## 2021-02-22 NOTE — Progress Notes (Signed)
To pacu, VSS. Report to [email or web address] 

## 2021-02-22 NOTE — Progress Notes (Signed)
Pt's states no medical or surgical changes since previsit or office visit.   VS NANCY

## 2021-02-22 NOTE — Progress Notes (Signed)
No problems noted in the recovery room. maw 

## 2021-02-22 NOTE — Op Note (Signed)
[facility] Patient Name: April Bray Procedure Date: 02/22/2021 8:18 AM MRN: [id number] Endoscopist: Jackquline Denmark , MD Age: 66 Referring MD:  Date of Birth: 1955-07-22 Gender: Female Account #: [id number] Procedure:                Colonoscopy Indications:              High risk colon cancer surveillance: Personal                            history of colonic polyps Medicines:                Monitored Anesthesia Care Procedure:                Pre-Anesthesia Assessment:                           - Prior to the procedure, a History and Physical                            was performed, and patient medications and                            allergies were reviewed. The patient's tolerance of                            previous anesthesia was also reviewed. The risks                            and benefits of the procedure and the sedation                            options and risks were discussed with the patient.                            All questions were answered, and informed consent                            was obtained. Prior Anticoagulants: The patient has                            taken no previous anticoagulant or antiplatelet                            agents. ASA Grade Assessment: III - A patient with                            severe systemic disease. After reviewing the risks                            and benefits, the patient was deemed in                            satisfactory condition to undergo the procedure.  After obtaining informed consent, the colonoscope                            was passed under direct vision. Throughout the                            procedure, the patient's blood pressure, pulse, and                            oxygen saturations were monitored continuously. The                            Olympus PCF-H190DL [id number]) Colonoscope was                            introduced through the anus and advanced to  the the                            cecum, identified by appendiceal orifice and                            ileocecal valve. The colonoscopy was performed                            without difficulty. The patient tolerated the                            procedure well. The quality of the bowel                            preparation was adequate to identify polyps. Some                            retained stool especially in the right side of the                            colon limiting examination. Aggressive suctioning                            and aspiration was performed. Overall over 90 to                            95% the current mucosa was visualized                            satisfactorily. Overall the examination was                            adequate. The ileocecal valve, appendiceal orifice,                            and rectum were photographed. Scope In: 8:51:29 AM Scope Out: 9:08:42 AM Scope Withdrawal Time: 0 hours 7 minutes 43 seconds  Total Procedure Duration: 0 hours 17 minutes  13 seconds  Findings:                 A few small-mouthed diverticula were found in the                            sigmoid colon.                           Non-bleeding internal hemorrhoids were found during                            retroflexion. The hemorrhoids were small and Grade                            I (internal hemorrhoids that do not prolapse).                           The exam was otherwise without abnormality on                            direct and retroflexion views. The colon was highly                            redundant. Complications:            No immediate complications. Estimated Blood Loss:     Estimated blood loss: none. Impression:               - Mild sigmoid diverticulosis                           - Non-bleeding internal hemorrhoids.                           - The examination was otherwise normal on direct                            and retroflexion views.                            - No specimens collected. Recommendation:           - Patient has a contact number available for                            emergencies. The signs and symptoms of potential                            delayed complications were discussed with the                            patient. Return to normal activities tomorrow.                            Written discharge instructions were provided to the  patient.                           - Resume previous diet.                           - Continue present medications.                           - Repeat colonoscopy is not recommended for                            screening purposes. Hence, repeat colonoscopy only                            if with any new problems. If ever needed in future,                            would need 2-day prep.                           - The findings and recommendations were discussed                            with the patient's family. Jackquline Denmark, MD 02/22/2021 9:14:57 AM This report has been signed electronically.

## 2021-02-22 NOTE — Progress Notes (Signed)
[facility] History and Physical   Primary Care Physician:  Berkley Harvey, NP   Reason for Procedure:   History of polyps  Plan:     colonoscopy     HPI: April Bray is a 66 y.o. female    Past Medical History:  Diagnosis Date   Allergy    Anxiety    Arthritis    Bilateral dry eyes 07-14-12   allery and dry eyes   Diabetes mellitus    Hyperlipidemia    Hypertension     Past Surgical History:  Procedure Laterality Date   cateract surgery Bilateral 2022   CHOLECYSTECTOMY     KNEE ARTHROSCOPY     right   ovarion cyst     bilateral   TOTAL KNEE ARTHROPLASTY Bilateral 07/20/2012   Procedure: TOTAL KNEE BILATERAL;  Surgeon: Mauri Pole, MD;  Location: [facility];  Service: Orthopedics;  Laterality: Bilateral;   TUBAL LIGATION     URETHRAL SLING      Prior to Admission medications   Medication Sig Start Date End Date Taking? Authorizing Provider  Apple Cider Vinegar 600 MG CAPS Take 1 capsule by mouth daily.   Yes [provider]  atorvastatin (LIPITOR) 10 MG tablet Take 10 mg by mouth daily with supper.  05/15/10  Yes [provider]  BLACK CURRANT SEED OIL PO Take by mouth 2 (two) times daily.   Yes [provider]  Calcium Carbonate-Vitamin D (CALTRATE 600+D PO) Take 1 capsule by mouth daily.     Yes [provider]  cetirizine (ZYRTEC) 10 MG tablet Take 10 mg by mouth daily.     Yes [provider]  cholecalciferol (VITAMIN D) 1000 UNITS tablet Take 1,000 Units by mouth daily.   Yes [provider]  Cinnamon 500 MG capsule Take 100 mg by mouth daily.    Yes [provider]  co-enzyme Q-10 30 MG capsule Take by mouth 2 (two) times daily.   Yes [provider]  docusate sodium 100 MG CAPS Take 100 mg by mouth 2 (two) times daily. 07/23/12  Yes Babish, Matthew, PA-C  liraglutide (VICTOZA) 18 MG/3ML SOPN Inject 1.8 mg into the skin daily. 08/26/16  Yes [provider]  lisinopril  (PRINIVIL,ZESTRIL) 10 MG tablet Take 10 mg by mouth daily with supper.  05/15/10  Yes [provider]  magnesium gluconate (MAGONATE) 500 MG tablet Take 500 mg by mouth 2 (two) times daily.   Yes [provider]  metFORMIN (GLUCOPHAGE) 1000 MG tablet Take 1,000 mg by mouth 2 (two) times daily with a meal.  06/11/10  Yes [provider]  Multiple Vitamin (MULTIVITAMIN) tablet Take 2 tablets by mouth 2 (two) times daily.     Yes [provider]  omeprazole (PRILOSEC) 40 MG capsule Take 40 mg by mouth daily as needed. 12/14/20  Yes [provider]  sertraline (ZOLOFT) 50 MG tablet Take 75 mg by mouth daily. 11/29/20  Yes [provider]  Turmeric Curcumin 500 MG CAPS Take 1 capsule by mouth 2 (two) times daily.   Yes [provider]  vitamin C (ASCORBIC ACID) 500 MG tablet Take 500 mg by mouth daily.   Yes [provider]  GNP GARLIC EXTRACT PO Take 696 mg by mouth 2 (two) times daily.    [provider]  TRESIBA FLEXTOUCH 200 UNIT/ML FlexTouch Pen Inject 60 Units into the skin at bedtime. 11/29/20   [provider]    Current  Outpatient Medications  Medication Sig Dispense Refill   Apple Cider Vinegar 600 MG CAPS Take 1 capsule by mouth daily.     atorvastatin (LIPITOR) 10 MG tablet Take 10 mg by mouth daily with supper.      BLACK CURRANT SEED OIL PO Take by mouth 2 (two) times daily.     Calcium Carbonate-Vitamin D (CALTRATE 600+D PO) Take 1 capsule by mouth daily.       cetirizine (ZYRTEC) 10 MG tablet Take 10 mg by mouth daily.       cholecalciferol (VITAMIN D) 1000 UNITS tablet Take 1,000 Units by mouth daily.     Cinnamon 500 MG capsule Take 100 mg by mouth daily.      co-enzyme Q-10 30 MG capsule Take by mouth 2 (two) times daily.     docusate sodium 100 MG CAPS Take 100 mg by mouth 2 (two) times daily. 10 capsule 0   liraglutide (VICTOZA) 18 MG/3ML SOPN Inject 1.8 mg into the skin daily.     lisinopril  (PRINIVIL,ZESTRIL) 10 MG tablet Take 10 mg by mouth daily with supper.      magnesium gluconate (MAGONATE) 500 MG tablet Take 500 mg by mouth 2 (two) times daily.     metFORMIN (GLUCOPHAGE) 1000 MG tablet Take 1,000 mg by mouth 2 (two) times daily with a meal.      Multiple Vitamin (MULTIVITAMIN) tablet Take 2 tablets by mouth 2 (two) times daily.       omeprazole (PRILOSEC) 40 MG capsule Take 40 mg by mouth daily as needed.     sertraline (ZOLOFT) 50 MG tablet Take 75 mg by mouth daily.     Turmeric Curcumin 500 MG CAPS Take 1 capsule by mouth 2 (two) times daily.     vitamin C (ASCORBIC ACID) 500 MG tablet Take 500 mg by mouth daily.     GNP GARLIC EXTRACT PO Take 462 mg by mouth 2 (two) times daily.     TRESIBA FLEXTOUCH 200 UNIT/ML FlexTouch Pen Inject 60 Units into the skin at bedtime.     Current Facility-Administered Medications  Medication Dose Route Frequency Provider Last Rate Last Admin   0.9 %  sodium chloride infusion  500 mL Intravenous Once Jackquline Denmark, MD        Allergies as of 02/22/2021   (No Known Allergies)    Family History  Problem Relation Age of Onset   Colon polyps Father    Colon cancer Maternal Grandmother    Colon cancer Maternal Grandfather     Social History   Socioeconomic History   Marital status: Married    Spouse name: Not on file   Number of children: 3   Years of education: Not on file   Highest education level: Not on file  Occupational History   Not on file  Tobacco Use   Smoking status: Never   Smokeless tobacco: Not on file  Substance and Sexual Activity   Alcohol use: No   Drug use: No   Sexual activity: Not Currently  Other Topics Concern   Not on file  Social History Narrative   Not on file   Social Determinants of Health   Financial Resource Strain: Not on file  Food Insecurity: Not on file  Transportation Needs: Not on file  Physical Activity: Not on file  Stress: Not on file  Social Connections: Not on file   Intimate Partner Violence: Not on file    Review of Systems: Positive for  none  All other review of systems negative except as mentioned in the HPI.  Physical Exam: Vital signs in last 24 hours: @VSRANGES @   General:   Alert,  Well-developed, well-nourished, pleasant and cooperative in NAD Lungs:  Clear throughout to auscultation.   Heart:  Regular rate and rhythm; no murmurs, clicks, rubs,  or gallops. Abdomen:  Soft, nontender and nondistended. Normal bowel sounds.   Neuro/Psych:  Alert and cooperative. Normal mood and affect. A and O x 3    No significant changes were identified.  The patient continues to be an appropriate candidate for the planned procedure and anesthesia.   Carmell Austria, MD. [facility] 02/22/2021 8:44 AM@

## 2021-02-26 ENCOUNTER — Telehealth: Payer: Self-pay

## 2021-02-26 NOTE — Telephone Encounter (Signed)
°  Follow up Call-  Call back number 02/22/2021  Post procedure Call Back phone  # [phone]  Permission to leave phone message Yes  Some recent data might be hidden     Patient questions:  Do you have a fever, pain , or abdominal swelling? No. Pain Score  0 *  Have you tolerated food without any problems? Yes.    Have you been able to return to your normal activities? Yes.    Do you have any questions about your discharge instructions: Diet   No. Medications  No. Follow up visit  No.  Do you have questions or concerns about your Care? No.  Actions: * If pain score is 4 or above: No action needed, pain <4.
